# Patient Record
Sex: Female | Born: 1990 | Race: Black or African American | Hispanic: No | Marital: Single | Smoking: Former smoker
Health system: Southern US, Community
[De-identification: ages and names within clinical notes are randomized; demographics above are authoritative.]

## PROBLEM LIST (undated history)

## (undated) ENCOUNTER — Inpatient Hospital Stay (HOSPITAL_COMMUNITY): Payer: Self-pay

## (undated) DIAGNOSIS — B9689 Other specified bacterial agents as the cause of diseases classified elsewhere: Secondary | ICD-10-CM

## (undated) DIAGNOSIS — N760 Acute vaginitis: Secondary | ICD-10-CM

## (undated) DIAGNOSIS — D219 Benign neoplasm of connective and other soft tissue, unspecified: Secondary | ICD-10-CM

## (undated) DIAGNOSIS — A749 Chlamydial infection, unspecified: Secondary | ICD-10-CM

## (undated) DIAGNOSIS — N83209 Unspecified ovarian cyst, unspecified side: Secondary | ICD-10-CM

## (undated) DIAGNOSIS — E669 Obesity, unspecified: Secondary | ICD-10-CM

## (undated) DIAGNOSIS — N390 Urinary tract infection, site not specified: Secondary | ICD-10-CM

## (undated) HISTORY — PX: ANKLE SURGERY: SHX546

## (undated) HISTORY — DX: Benign neoplasm of connective and other soft tissue, unspecified: D21.9

## (undated) HISTORY — PX: INDUCED ABORTION: SHX677

## (undated) HISTORY — DX: Urinary tract infection, site not specified: N39.0

## (undated) HISTORY — DX: Obesity, unspecified: E66.9

---
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
## 2011-01-18 ENCOUNTER — Encounter (HOSPITAL_COMMUNITY): Payer: Self-pay

## 2011-01-18 ENCOUNTER — Emergency Department (INDEPENDENT_AMBULATORY_CARE_PROVIDER_SITE_OTHER)
Admission: EM | Admit: 2011-01-18 | Discharge: 2011-01-18 | Disposition: A | Discharge status: Home / Self Care | Source: Home / Self Care | Attending: Family Medicine | Admitting: Family Medicine

## 2011-01-18 DIAGNOSIS — R1030 Lower abdominal pain, unspecified: Secondary | ICD-10-CM

## 2011-01-18 DIAGNOSIS — R109 Unspecified abdominal pain: Secondary | ICD-10-CM

## 2011-01-18 LAB — POCT URINALYSIS DIP (DEVICE)
Hgb urine dipstick: NEGATIVE
Leukocytes, UA: NEGATIVE
Nitrite: NEGATIVE
Protein, ur: NEGATIVE mg/dL
pH: 6.5 (ref 5.0–8.0)

## 2011-01-18 LAB — WET PREP, GENITAL
Clue Cells Wet Prep HPF POC: NONE SEEN
Trich, Wet Prep: NONE SEEN

## 2011-01-18 NOTE — ED Notes (Signed)
Bilateral Lower abdominal area pain for past few weeks, no change in usual vaginal secretions; has IUD , that is reportedly difficult to locate; c/o episodic nausea

## 2011-01-18 NOTE — ED Provider Notes (Signed)
History     CSN: [id number] Arrival date & time: 01/18/2011  5:42 PM   First MD Initiated Contact with Patient 01/18/11 1627      Chief Complaint  Patient presents with  . Abdominal Pain    (Consider location/radiation/quality/duration/timing/severity/associated sxs/prior treatment) Patient is a 20 y.o. female presenting with abdominal pain. The history is provided by the patient.  Abdominal Pain The primary symptoms of the illness include abdominal pain and nausea. The primary symptoms of the illness do not include diarrhea, dysuria, vaginal discharge or vaginal bleeding. The current episode started more than 2 days ago. The onset of the illness was sudden. The problem has not changed since onset. The abdominal pain is located in the LLQ, RLQ and suprapubic region. The abdominal pain is relieved by nothing.  Symptoms associated with the illness do not include frequency.    History reviewed. No pertinent past medical history.  History reviewed. No pertinent past surgical history.  History reviewed. No pertinent family history.  History  Substance Use Topics  . Smoking status: Former Games developer  . Smokeless tobacco: Not on file  . Alcohol Use: No    OB History    Grav Para Term Preterm Abortions TAB SAB Ect Mult Living                  Review of Systems  Constitutional: Negative.   HENT: Negative.   Eyes: Negative.   Respiratory: Negative.   Cardiovascular: Negative.   Gastrointestinal: Positive for nausea and abdominal pain. Negative for diarrhea.  Genitourinary: Negative for dysuria, frequency, flank pain, vaginal bleeding, vaginal discharge and menstrual problem.    Allergies  Shellfish allergy  Home Medications   Current Outpatient Rx  Name Route Sig Dispense Refill  . NORGESTIMATE-ETH ESTRADIOL 0.25-35 MG-MCG PO TABS Oral Take 1 tablet by mouth daily. 1 month supply       BP 118/75  Pulse 84  Temp(Src) 98.1 F (36.7 C) (Oral)  Resp 18  SpO2  100%  Physical Exam  Constitutional: She appears well-developed and well-nourished.  HENT:  Head: Normocephalic and atraumatic.  Eyes: EOM are normal.  Neck: Neck supple.  Genitourinary: Vagina normal. There is no rash or tenderness on the right labia. There is no rash or tenderness on the left labia. Uterus is not enlarged and not tender. Cervix exhibits no motion tenderness and no discharge. Right adnexum displays no mass, no tenderness and no fullness. Left adnexum displays no mass, no tenderness and no fullness.       No IUD strings visible; cervical os slightly opened with mucus from the os; per patient, IUD strings were cut too short; scant vaginal discharge;     ED Course  Procedures (including critical care time)  Labs Reviewed - No data to display No results found.   No diagnosis found.    MDM  No IUD strings visible; wet prep pending; UA and Upreg negative        Richardo Priest, MD 01/18/11 1913

## 2011-01-18 NOTE — ED Notes (Signed)
Unable to locate Rx at time of d/c ; have called Rx for sprintec to [facility] at request of patient

## 2011-01-19 LAB — GC/CHLAMYDIA PROBE AMP, GENITAL
Chlamydia, DNA Probe: NEGATIVE
GC Probe Amp, Genital: NEGATIVE

## 2011-03-14 DIAGNOSIS — A749 Chlamydial infection, unspecified: Secondary | ICD-10-CM

## 2011-03-14 HISTORY — DX: Chlamydial infection, unspecified: A74.9

## 2011-04-18 ENCOUNTER — Emergency Department (INDEPENDENT_AMBULATORY_CARE_PROVIDER_SITE_OTHER)
Admission: EM | Admit: 2011-04-18 | Discharge: 2011-04-18 | Disposition: A | Discharge status: Home / Self Care | Source: Home / Self Care | Attending: Family Medicine | Admitting: Family Medicine

## 2011-04-18 ENCOUNTER — Encounter (HOSPITAL_COMMUNITY): Payer: Self-pay | Admitting: *Deleted

## 2011-04-18 DIAGNOSIS — N72 Inflammatory disease of cervix uteri: Secondary | ICD-10-CM

## 2011-04-18 LAB — POCT URINALYSIS DIP (DEVICE)
Bilirubin Urine: NEGATIVE
Ketones, ur: NEGATIVE mg/dL
Nitrite: NEGATIVE
Protein, ur: NEGATIVE mg/dL
pH: 5.5 (ref 5.0–8.0)

## 2011-04-18 LAB — WET PREP, GENITAL: Yeast Wet Prep HPF POC: NONE SEEN

## 2011-04-18 MED ORDER — METRONIDAZOLE 500 MG PO TABS: 500.0000 mg | ORAL_TABLET | Freq: Two times a day (BID) | ORAL | Status: AC

## 2011-04-18 MED ORDER — AZITHROMYCIN 250 MG PO TABS
ORAL_TABLET | ORAL | Status: AC
  Filled 2011-04-18: qty 4

## 2011-04-18 MED ORDER — CEFTRIAXONE SODIUM 250 MG IJ SOLR
250.0000 mg | Freq: Once | INTRAMUSCULAR | Status: AC
  Administered 2011-04-18: 250 mg via INTRAMUSCULAR

## 2011-04-18 MED ORDER — AZITHROMYCIN 250 MG PO TABS
1000.0000 mg | ORAL_TABLET | Freq: Once | ORAL | Status: AC
  Administered 2011-04-18: 1000 mg via ORAL

## 2011-04-18 MED ORDER — CEFTRIAXONE SODIUM 250 MG IJ SOLR
INTRAMUSCULAR | Status: AC
  Filled 2011-04-18: qty 250

## 2011-04-18 NOTE — ED Notes (Signed)
Pt  Reports  Low  abd  Cramps     With    Vaginal  Irritation           Reports  Some  Frequency  Of  Urination  As  Well     Symptoms  X  3  Weeks

## 2011-04-19 LAB — GC/CHLAMYDIA PROBE AMP, GENITAL: Chlamydia, DNA Probe: POSITIVE — AB

## 2011-04-20 ENCOUNTER — Telehealth (HOSPITAL_COMMUNITY): Payer: Self-pay | Admitting: *Deleted

## 2011-04-20 NOTE — ED Provider Notes (Signed)
History     CSN: [id number]  Arrival date & time 04/18/11  1747   First MD Initiated Contact with Patient 04/18/11 2004      Chief Complaint  Patient presents with  . Abdominal Cramping    (Consider location/radiation/quality/duration/timing/severity/associated sxs/prior treatment) HPI Comments: 21 y/o female no significant PMH here c/o vaginal irritation, vaginal discharge and low abdominal discomfort, frequency for 3 weeks. No fever or chills no nausea or vomiting.    History reviewed. No pertinent past medical history.  Past Surgical History  Procedure Date  . Ankle surgery     No family history on file.  History  Substance Use Topics  . Smoking status: Current Everyday Smoker  . Smokeless tobacco: Not on file  . Alcohol Use: Yes    OB History    Grav Para Term Preterm Abortions TAB SAB Ect Mult Living                  Review of Systems  Constitutional: Negative for fever and chills.  HENT: Negative for sore throat and neck pain.   Genitourinary: Positive for frequency, vaginal discharge, vaginal pain and pelvic pain. Negative for dysuria, flank pain, vaginal bleeding and genital sores.  Skin: Negative for rash.  Neurological: Negative for headaches.    Allergies  Shellfish allergy  Home Medications   Current Outpatient Rx  Name Route Sig Dispense Refill  . METRONIDAZOLE 500 MG PO TABS Oral Take 1 tablet (500 mg total) by mouth 2 (two) times daily. 14 tablet 0  . NORGESTIMATE-ETH ESTRADIOL 0.25-35 MG-MCG PO TABS Oral Take 1 tablet by mouth daily. 1 month supply       BP 120/70  Pulse 70  Temp(Src) 98.6 F (37 C) (Oral)  Resp 18  Physical Exam  Nursing note and vitals reviewed. Constitutional: She is oriented to person, place, and time. She appears well-developed and well-nourished. No distress.  HENT:  Head: Normocephalic and atraumatic.  Mouth/Throat: Oropharynx is clear and moist. No oropharyngeal exudate.  Eyes: Conjunctivae are normal.  Pupils are equal, round, and reactive to light.  Neck: Neck supple.  Cardiovascular: Normal heart sounds.   Pulmonary/Chest: Breath sounds normal.  Abdominal: Soft. She exhibits no mass. There is no tenderness. There is no rebound and no guarding. Hernia confirmed negative in the right inguinal area and confirmed negative in the left inguinal area.  Genitourinary: Uterus normal. Uterus is not enlarged and not tender. Cervix exhibits discharge and friability. Cervix exhibits no motion tenderness. Right adnexum displays no mass, no tenderness and no fullness. Left adnexum displays no mass, no tenderness and no fullness. There is erythema around the vagina. No bleeding around the vagina. No foreign body around the vagina. No signs of injury around the vagina. Vaginal discharge found.  Lymphadenopathy:    She has no cervical adenopathy.       Right: No inguinal adenopathy present.       Left: No inguinal adenopathy present.  Neurological: She is alert and oriented to person, place, and time.  Skin: No rash noted.    ED Course  Procedures (including critical care time)  Labs Reviewed  POCT URINALYSIS DIP (DEVICE) - Abnormal; Notable for the following:    Hgb urine dipstick SMALL (*)    Leukocytes, UA TRACE (*) Biochemical Testing Only. Please order routine urinalysis from main lab if confirmatory testing is needed.   All other components within normal limits  GC/CHLAMYDIA PROBE AMP, GENITAL - Abnormal; Notable for the following:  Chlamydia, DNA Probe POSITIVE (*)    All other components within normal limits  WET PREP, GENITAL - Abnormal; Notable for the following:    Clue Cells Wet Prep HPF POC MANY (*)    WBC, Wet Prep HPF POC MANY (*)    All other components within normal limits  POCT PREGNANCY, URINE  LAB REPORT - SCANNED   No results found.   1. Cervicitis       MDM  Clinically well. Erythema of cervic with endocervical exudate, no CMT. Treated prior discharge empirically  with rocephin 250mg  IMx1 and azithromycin 1 gram oral x1. Before results for for GH/CHl available. Encouraged condom use. Pt will be contacted and informed about her lab results.         Sharin Grave, MD 04/20/11 1135

## 2011-04-20 NOTE — ED Notes (Signed)
GC neg., Chlamydia pos., Wet prep: many clue cells, many WBC's. Pt. adequately treated with Zithromax for Chlamydia and Flagyl for bacterial vaginosis. DHHS form completed and faxed to the [facility]. Pt. verified, notified, told she was adeq. treated and given instructions ( see telephone encounter). Vassie Moselle 04/20/2011

## 2011-08-24 ENCOUNTER — Emergency Department (INDEPENDENT_AMBULATORY_CARE_PROVIDER_SITE_OTHER)
Admission: EM | Admit: 2011-08-24 | Discharge: 2011-08-24 | Disposition: A | Discharge status: Home / Self Care | Source: Home / Self Care | Attending: Emergency Medicine | Admitting: Emergency Medicine

## 2011-08-24 ENCOUNTER — Encounter (HOSPITAL_COMMUNITY): Payer: Self-pay | Admitting: *Deleted

## 2011-08-24 DIAGNOSIS — N760 Acute vaginitis: Secondary | ICD-10-CM

## 2011-08-24 HISTORY — DX: Chlamydial infection, unspecified: A74.9

## 2011-08-24 HISTORY — DX: Unspecified ovarian cyst, unspecified side: N83.209

## 2011-08-24 HISTORY — DX: Acute vaginitis: N76.0

## 2011-08-24 HISTORY — DX: Other specified bacterial agents as the cause of diseases classified elsewhere: B96.89

## 2011-08-24 LAB — POCT URINALYSIS DIP (DEVICE)
Bilirubin Urine: NEGATIVE
Glucose, UA: NEGATIVE mg/dL
Hgb urine dipstick: NEGATIVE
Specific Gravity, Urine: >=1.03 (ref 1.005–1.030)
pH: 5.5 (ref 5.0–8.0)

## 2011-08-24 LAB — WET PREP, GENITAL: Yeast Wet Prep HPF POC: NONE SEEN

## 2011-08-24 MED ORDER — AZITHROMYCIN 250 MG PO TABS
ORAL_TABLET | ORAL | Status: AC
  Filled 2011-08-24: qty 4

## 2011-08-24 MED ORDER — CEFTRIAXONE SODIUM 250 MG IJ SOLR
250.0000 mg | Freq: Once | INTRAMUSCULAR | Status: AC
  Administered 2011-08-24: 250 mg via INTRAMUSCULAR

## 2011-08-24 MED ORDER — CEFTRIAXONE SODIUM 250 MG IJ SOLR
INTRAMUSCULAR | Status: AC
  Filled 2011-08-24: qty 250

## 2011-08-24 MED ORDER — NAPROXEN 500 MG PO TABS: 500.0000 mg | ORAL_TABLET | Freq: Two times a day (BID) | ORAL | Status: DC

## 2011-08-24 MED ORDER — AZITHROMYCIN 250 MG PO TABS
1000.0000 mg | ORAL_TABLET | Freq: Once | ORAL | Status: AC
  Administered 2011-08-24: 1000 mg via ORAL

## 2011-08-24 MED ORDER — FLUCONAZOLE 150 MG PO TABS: ORAL_TABLET | ORAL | Status: AC

## 2011-08-24 MED ORDER — METRONIDAZOLE 500 MG PO TABS: 500.0000 mg | ORAL_TABLET | Freq: Two times a day (BID) | ORAL | Status: AC

## 2011-08-24 NOTE — ED Notes (Signed)
Pt  Was  Seen  Lat  Month  For  Std   adeq  tx     -  Continues  To  Have  Discharge  And  Low  abd   Cramping            She  Walks  Upright  With a  Slow  Steady  Gait

## 2011-08-24 NOTE — ED Provider Notes (Signed)
History     CSN: [id number]  Arrival date & time 08/24/11  1108   First MD Initiated Contact with Patient 08/24/11 1155      Chief Complaint  Patient presents with  . Vaginal Discharge    (Consider location/radiation/quality/duration/timing/severity/associated sxs/prior treatment) HPI Comments: Pt with  3 week's oderous vaginal discharge, intermittent, crampy, bilateral lower bowel pain. No urgency, frequency, dysuria, oderous urine, hematuria,  genital blisters, vaginal itching. She tried some OTC Monistat with some resolution in the vaginal discharge, but it returned. No fevers, N/V, back pain. No recent abx use. Pt sexually active with the same female partner who gave her Chlamydia in February. She states that she lied to her about getting treated, and is concerned that she may have chlamydia again. does not use condoms. STD's  a concern today. Similar sx before when had BV  and chlamydia. She was treated for both of these in February 2013.  Has a history of yeast infections.. No h/o trichomonas, syphilis, herpes, HIV.  ROS as noted in HPI. All other ROS negative.     Patient is a 21 y.o. female presenting with vaginal discharge. The history is provided by the patient. No language interpreter was used.  Vaginal Discharge This is a recurrent problem. The current episode started more than 1 week ago. The problem occurs constantly. The problem has not changed since onset.Associated symptoms include abdominal pain. Nothing aggravates the symptoms. The symptoms are relieved by medications. The treatment provided mild relief.    Past Medical History  Diagnosis Date  . BV (bacterial vaginosis)   . Chlamydia   . Ovarian cyst     Past Surgical History  Procedure Date  . Ankle surgery     History reviewed. No pertinent family history.  History  Substance Use Topics  . Smoking status: Current Everyday Smoker  . Smokeless tobacco: Not on file  . Alcohol Use: Yes    OB History    Grav Para Term Preterm Abortions TAB SAB Ect Mult Living                  Review of Systems  Gastrointestinal: Positive for abdominal pain.  Genitourinary: Positive for vaginal discharge.    Allergies  Shellfish allergy  Home Medications   Current Outpatient Rx  Name Route Sig Dispense Refill  . FLUCONAZOLE 150 MG PO TABS  1 tab po x 1. May repeat in 72 hours if no improvement 2 tablet 0  . METRONIDAZOLE 500 MG PO TABS Oral Take 1 tablet (500 mg total) by mouth 2 (two) times daily. X 7 days 14 tablet 0  . NAPROXEN 500 MG PO TABS Oral Take 1 tablet (500 mg total) by mouth 2 (two) times daily. 20 tablet 0    BP 123/71  Pulse 80  Temp 98.3 F (36.8 C) (Oral)  Resp 14  SpO2 99%  Physical Exam  Nursing note and vitals reviewed. Constitutional: She is oriented to person, place, and time. She appears well-developed and well-nourished. No distress.  HENT:  Head: Normocephalic and atraumatic.  Eyes: EOM are normal.  Neck: Normal range of motion.  Cardiovascular: Normal rate.   Pulmonary/Chest: Effort normal.  Abdominal: Soft. Bowel sounds are normal. She exhibits no distension. There is no tenderness. There is no rebound and no guarding.  Genitourinary: Uterus normal. Pelvic exam was performed with patient supine. There is no rash on the right labia. There is no rash on the left labia. Uterus is not tender. Cervix exhibits  no motion tenderness and no friability. Right adnexum displays no mass, no tenderness and no fullness. Left adnexum displays no mass, no tenderness and no fullness. No erythema, tenderness or bleeding around the vagina. No foreign body around the vagina. Vaginal discharge found.       Thick white, cottage cheesy nonodorous vaginal discharge. Some mucopurulent discharge from os. Chaperone present during exam  Musculoskeletal: Normal range of motion.  Neurological: She is alert and oriented to person, place, and time.  Skin: Skin is warm and dry.  Psychiatric: She  has a normal mood and affect. Her behavior is normal. Judgment and thought content normal.    ED Course  Procedures (including critical care time)   Labs Reviewed  POCT URINALYSIS DIP (DEVICE)  POCT PREGNANCY, URINE  WET PREP, GENITAL  GC/CHLAMYDIA PROBE AMP, GENITAL   No results found.   1. Vaginitis    Results for orders placed during the hospital encounter of 08/24/11  POCT URINALYSIS DIP (DEVICE)      Component Value Range   Glucose, UA NEGATIVE  NEGATIVE mg/dL   Bilirubin Urine NEGATIVE  NEGATIVE   Ketones, ur NEGATIVE  NEGATIVE mg/dL   Specific Gravity, Urine >=1.030  1.005 - 1.030   Hgb urine dipstick NEGATIVE  NEGATIVE   pH 5.5  5.0 - 8.0   Protein, ur NEGATIVE  NEGATIVE mg/dL   Urobilinogen, UA 0.2  0.0 - 1.0 mg/dL   Nitrite NEGATIVE  NEGATIVE   Leukocytes, UA NEGATIVE  NEGATIVE  POCT PREGNANCY, URINE      Component Value Range   Preg Test, Ur NEGATIVE  NEGATIVE     MDM  Previous labs reviewed. History of recurrent BV and Chlamydia. H&P most c/w yeast infection, but will also treat for BV as she has had this twice recently. Sent off GC/chlamydia, wet prep. Will treat empirically now. Giving ceftriaxone 250 mg IM/azithro 1 gm po. Will send home with flagyl and  diflucan for yeast infection. Advised pt to refrain from sexual contact until she knows lab results, symptoms resolve, and partner(s) are treated. Pt provided working phone number. Pt agrees.     Luiz Blare, MD 08/24/11 1256

## 2011-08-24 NOTE — Discharge Instructions (Signed)
Take the medication as written. Give us a working phone number so that we can contact you if needed. Refrain from sexual contact until you know your results and your partner(s) are treated. Return if you get worse, have a fever >100.4, or for any concerns.  ° °Go to www.goodrx.com to look up your medications. This will give you a list of where you can find your prescriptions at the most affordable prices.  °

## 2011-08-26 LAB — GC/CHLAMYDIA PROBE AMP, GENITAL
Chlamydia, DNA Probe: POSITIVE — AB
GC Probe Amp, Genital: NEGATIVE

## 2011-08-28 ENCOUNTER — Telehealth (HOSPITAL_COMMUNITY): Payer: Self-pay | Admitting: *Deleted

## 2011-08-28 NOTE — ED Notes (Signed)
GC neg., Chlamydia pos., Wet prep: few clue cells, mod. WBC's. Pt. Adequately treated with Zithromax and Flagyl.  I called cell # but it is not in service. I called home # and left a message to call. DHHS form completed and faxed to the [facility]. Vassie Moselle 08/28/2011

## 2011-08-29 ENCOUNTER — Telehealth (HOSPITAL_COMMUNITY): Payer: Self-pay | Admitting: *Deleted

## 2011-08-29 NOTE — ED Notes (Signed)
Left message to call.  Call 2. Janice Chan 08/29/2011

## 2011-08-30 ENCOUNTER — Telehealth (HOSPITAL_COMMUNITY): Payer: Self-pay | Admitting: *Deleted

## 2011-08-30 NOTE — ED Notes (Signed)
I called pt.'s contact Page Spiro -aunt and left a message for pt. to call. Vassie Moselle 08/30/2011

## 2011-08-30 NOTE — ED Notes (Signed)
Pt. called back and was verified x 2. Pt. given results and told she was adequately treated with Zithromax for Chlamydia and Flagyl for possible bacterial vaginosis.   Pt. instructed to notify her partner, no sex for 1 week and to practice safe sex. Pt. told she can get HIV testing at the [facility]. STD clinic. Vassie Moselle 08/30/2011

## 2012-01-19 ENCOUNTER — Encounter: Payer: Self-pay | Admitting: Obstetrics & Gynecology

## 2012-01-19 ENCOUNTER — Encounter: Payer: Self-pay | Admitting: *Deleted

## 2012-01-19 ENCOUNTER — Ambulatory Visit (INDEPENDENT_AMBULATORY_CARE_PROVIDER_SITE_OTHER): Admitting: Obstetrics & Gynecology

## 2012-01-19 VITALS — BP 129/85 | HR 73 | Temp 97.4°F | Ht 64.75 in | Wt 180.6 lb

## 2012-01-19 DIAGNOSIS — N949 Unspecified condition associated with female genital organs and menstrual cycle: Secondary | ICD-10-CM

## 2012-01-19 DIAGNOSIS — Z23 Encounter for immunization: Secondary | ICD-10-CM

## 2012-01-19 DIAGNOSIS — R102 Pelvic and perineal pain: Secondary | ICD-10-CM

## 2012-01-19 MED ORDER — HPV QUADRIVALENT VACCINE IM SUSP: 0.5000 mL | Freq: Once | INTRAMUSCULAR | Status: DC

## 2012-01-19 NOTE — Progress Notes (Signed)
Here today for pelvic pain, states last year was told had ovarian cyst. Has an IUD in place. C/o intermittent, frequent throbbing pain mid pelvic and states sometimes shoots across abdomen.  Takes midol and was working , but now takes strong motrin or took aunt's vicodin once and that helped relieve the pain.  States when it is hurting a lot, can't sit up straight because of pain and pressure, has to lean back. Also c/o pain with intercourse for last 2 months.

## 2012-01-19 NOTE — Progress Notes (Signed)
  Subjective:    Patient ID: Janice Chan, female    DOB: December 09, 1990, 21 y.o.   MRN: [id number]  HPI  21 yo S AA G0 who is here for 2 month h/o pelvic pain. She has had 3 occasions of +chlamydia in the last few years, most recently 5 months ago. I could not find a TOC noted. She reports dyspareunia for the last several months.   Review of Systems She has had 2 Gardasil shots, but not the third. Mirena in place    Objective:   Physical Exam  Whitish cervical discharge, IUD strings not seen (she says they are not visible because they were cut short) No CMT NSS midplane, NT, No adnexal masses or tenderness      Assessment & Plan:  Preventative care- restart the Gardasil series today. RTC 2 months Pelvic pain-may be related to her recurrent CT infections. She and I have discussed safe sex practices and possible consequences of PID (including infertility, CPP). I will recheck for GC/CT today Flu vaccine today

## 2012-01-20 LAB — GC/CHLAMYDIA PROBE AMP, URINE
Chlamydia, Swab/Urine, PCR: NEGATIVE
GC Probe Amp, Urine: NEGATIVE

## 2012-01-24 ENCOUNTER — Ambulatory Visit (HOSPITAL_COMMUNITY)
Admission: RE | Admit: 2012-01-24 | Discharge: 2012-01-24 | Disposition: A | Discharge status: Home / Self Care | Source: Ambulatory Visit | Attending: Obstetrics & Gynecology | Admitting: Obstetrics & Gynecology

## 2012-01-24 DIAGNOSIS — Z30431 Encounter for routine checking of intrauterine contraceptive device: Secondary | ICD-10-CM | POA: Insufficient documentation

## 2012-01-24 DIAGNOSIS — R102 Pelvic and perineal pain: Secondary | ICD-10-CM

## 2012-01-24 DIAGNOSIS — N949 Unspecified condition associated with female genital organs and menstrual cycle: Secondary | ICD-10-CM | POA: Insufficient documentation

## 2012-03-14 ENCOUNTER — Telehealth: Payer: Self-pay | Admitting: *Deleted

## 2012-03-14 NOTE — Telephone Encounter (Signed)
Pt left a message stating that she wants to know the results of her ultrasound and labs from her visit with Dr. Marice Potter in November. She is still having some pain.

## 2012-03-15 NOTE — Telephone Encounter (Signed)
Called pt and informed her of normal [place] and negative GC/Chlamydia results.  Pt states she is continuing to have pelvic pain and does not understand why. I asked pt is she would like a f/u appt to see MD at the time of her next Gardasil injection which needs to be later this month. Pt stated yes. I informed her that someone will call her next week with appt information. Pt voiced understanding.

## 2012-03-19 ENCOUNTER — Encounter: Payer: Self-pay | Admitting: Obstetrics & Gynecology

## 2012-04-03 ENCOUNTER — Ambulatory Visit: Admitting: Obstetrics & Gynecology

## 2012-04-08 ENCOUNTER — Ambulatory Visit (INDEPENDENT_AMBULATORY_CARE_PROVIDER_SITE_OTHER): Admitting: Obstetrics & Gynecology

## 2012-04-08 ENCOUNTER — Encounter: Payer: Self-pay | Admitting: Obstetrics & Gynecology

## 2012-04-08 VITALS — BP 122/78 | HR 92 | Temp 97.7°F | Ht 64.5 in | Wt 179.4 lb

## 2012-04-08 DIAGNOSIS — Z0000 Encounter for general adult medical examination without abnormal findings: Secondary | ICD-10-CM

## 2012-04-08 DIAGNOSIS — N949 Unspecified condition associated with female genital organs and menstrual cycle: Secondary | ICD-10-CM

## 2012-04-08 DIAGNOSIS — G8929 Other chronic pain: Secondary | ICD-10-CM

## 2012-04-08 DIAGNOSIS — Z01419 Encounter for gynecological examination (general) (routine) without abnormal findings: Secondary | ICD-10-CM

## 2012-04-08 NOTE — Progress Notes (Signed)
  Subjective:    Patient ID: Janice Chan, female    DOB: 12-09-1990, 21 y.o.   MRN: [id number]  HPI  22 yo S AA lady here for follow up of her pelvic discomfort and for her second Gardasil vaccine. She says that her pelvic pain is much less and is not causing her difficulties. Her u/s 11-13 was normal. IUD was seen in normal position.  Review of Systems  Her GC and CT from last visit were negative.    Objective:   Physical Exam        Assessment & Plan:  As above

## 2012-04-08 NOTE — Progress Notes (Signed)
We were not able to administer ordered Giardasil today. We ordered Rx and will arrive in a couple of days. Made note under "staff message" to call patient when Rx arrives and to schedule a nurse visit to administer it. Ok by Dr. Marice Potter and patient.

## 2012-04-10 ENCOUNTER — Telehealth: Payer: Self-pay | Admitting: General Practice

## 2012-04-10 NOTE — Telephone Encounter (Signed)
Called patient, no answer and the voicemail box had not been set up yet so I was unable to leave a message

## 2012-04-10 NOTE — Telephone Encounter (Signed)
Message copied by Kathee Delton on Wed Apr 10, 2012 10:02 AM ------      Message from: Toula Moos      Created: Mon Apr 08, 2012  4:18 PM      Regarding: Call patient when Janith Lima arrives       Please call patient [place], [place] when new order of Janith Lima arrives and schedule a nurse visit to administer. Thank you.

## 2012-04-10 NOTE — Telephone Encounter (Signed)
Spoke to patient and made appt for Giardasil injection to be administered on 04/17/12 @1 :30pm. Patient agrees and satisfied.

## 2012-04-17 ENCOUNTER — Ambulatory Visit

## 2012-09-10 ENCOUNTER — Encounter: Payer: Self-pay | Admitting: Family Medicine

## 2012-09-10 ENCOUNTER — Ambulatory Visit (INDEPENDENT_AMBULATORY_CARE_PROVIDER_SITE_OTHER): Admitting: Family Medicine

## 2012-09-10 VITALS — BP 114/75 | HR 90 | Wt 167.0 lb

## 2012-09-10 DIAGNOSIS — Z0000 Encounter for general adult medical examination without abnormal findings: Secondary | ICD-10-CM

## 2012-09-10 NOTE — Progress Notes (Signed)
Subjective:     Patient ID: Janice Chan, female   DOB: October 17, 1990, 22 y.o.   MRN: [id number]  HPI 22 year old female presents for yearly check up.   - Patient requesting note on letterhead discussing mild allergy to shellfish as she is entering the [employer].   - Patient has no current complaints and states that she is doing well. - Currently on no medications.   - Currently using Mirena for Contraception.   - No current chronic medical problems.   Review of Systems Patient denies SOB, chest pain, fevers, chills, N/V/D.    Objective:   Physical Exam Filed Vitals:   09/10/12 1600  BP: 114/75  Pulse: 90  Gen: well appearing female in NAD. Heart: RRR. No m/r/g. Lungs: CTAB. No rales, rhonchi, or wheezing. Abd: soft, nontender, nondistended.  Ext: no appreciable lower extremity edema bilaterally Neuro: no focal deficits.   *Patient deferred Pelvic exam/pap smear.  Patient to follow up for exam this year after discussion of ACOG guidelines recommending Pap smear starting at age 62.    Assessment:  Healthy 23 year old female.    Plan:     Patient given letter stating mild allergy to shellfish.  Patient to follow up for Pap smear/breast exam.

## 2012-09-30 ENCOUNTER — Telehealth: Payer: Self-pay | Admitting: Family Medicine

## 2012-09-30 NOTE — Telephone Encounter (Signed)
Pt is requesting a referral per the [employer]. 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 So she would like  Referral to one that accepts tri-care. JW

## 2012-10-02 NOTE — Telephone Encounter (Signed)
I'm unsure why this is really necessary.  The patient has a mild shellfish allergy.  Is there anyway the person Sales promotion account executive) can contact me?

## 2012-10-03 ENCOUNTER — Telehealth: Payer: Self-pay | Admitting: Family Medicine

## 2012-10-03 NOTE — Telephone Encounter (Signed)
Patient calls asking to speak to Dr. Adriana Simas. She says she spoke to her recruiter about her allergy testing and now has questions for Dr. Adriana Simas.

## 2012-10-03 NOTE — Telephone Encounter (Signed)
Spoke with patient she states that proof of testing is needed to confirm her shellfish allergy. She would like to be either referred to an allergist or see if this can be proven by Dr. Adriana Simas.

## 2012-10-07 ENCOUNTER — Telehealth: Payer: Self-pay | Admitting: Family Medicine

## 2012-10-07 DIAGNOSIS — Z91013 Allergy to seafood: Secondary | ICD-10-CM

## 2012-10-07 NOTE — Addendum Note (Signed)
Addended by: Tommie Sams on: 10/07/2012 06:06 PM   Modules accepted: Orders

## 2012-10-07 NOTE — Telephone Encounter (Signed)
Order placed for Allergy referral

## 2012-10-07 NOTE — Telephone Encounter (Signed)
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 JW

## 2013-10-03 ENCOUNTER — Other Ambulatory Visit: Payer: Self-pay | Admitting: Family Medicine

## 2013-10-03 DIAGNOSIS — Z30432 Encounter for removal of intrauterine contraceptive device: Secondary | ICD-10-CM

## 2013-10-06 ENCOUNTER — Ambulatory Visit
Admission: RE | Admit: 2013-10-06 | Discharge: 2013-10-06 | Disposition: A | Discharge status: Home / Self Care | Payer: BC Managed Care – PPO | Source: Ambulatory Visit | Attending: Family Medicine | Admitting: Family Medicine

## 2013-10-06 DIAGNOSIS — Z30432 Encounter for removal of intrauterine contraceptive device: Secondary | ICD-10-CM

## 2013-11-25 ENCOUNTER — Other Ambulatory Visit: Payer: Self-pay | Admitting: Family Medicine

## 2013-11-25 DIAGNOSIS — H538 Other visual disturbances: Secondary | ICD-10-CM

## 2013-11-25 DIAGNOSIS — G44029 Chronic cluster headache, not intractable: Secondary | ICD-10-CM

## 2013-11-30 ENCOUNTER — Ambulatory Visit
Admission: RE | Admit: 2013-11-30 | Discharge: 2013-11-30 | Disposition: A | Discharge status: Home / Self Care | Payer: BC Managed Care – PPO | Source: Ambulatory Visit | Attending: Family Medicine | Admitting: Family Medicine

## 2013-11-30 DIAGNOSIS — G44029 Chronic cluster headache, not intractable: Secondary | ICD-10-CM

## 2013-11-30 DIAGNOSIS — H538 Other visual disturbances: Secondary | ICD-10-CM

## 2013-11-30 MED ORDER — GADOBENATE DIMEGLUMINE 529 MG/ML IV SOLN
15.0000 mL | Freq: Once | INTRAVENOUS | Status: AC | PRN
  Administered 2013-11-30: 15 mL via INTRAVENOUS

## 2014-05-18 ENCOUNTER — Emergency Department (HOSPITAL_COMMUNITY)
Admission: EM | Admit: 2014-05-18 | Discharge: 2014-05-18 | Disposition: A | Discharge status: Home / Self Care | Payer: Medicaid Other | Attending: Emergency Medicine | Admitting: Emergency Medicine

## 2014-05-18 ENCOUNTER — Encounter (HOSPITAL_COMMUNITY): Payer: Self-pay | Admitting: Emergency Medicine

## 2014-05-18 DIAGNOSIS — L02411 Cutaneous abscess of right axilla: Secondary | ICD-10-CM | POA: Diagnosis not present

## 2014-05-18 DIAGNOSIS — E669 Obesity, unspecified: Secondary | ICD-10-CM | POA: Diagnosis not present

## 2014-05-18 DIAGNOSIS — Z87891 Personal history of nicotine dependence: Secondary | ICD-10-CM | POA: Diagnosis not present

## 2014-05-18 DIAGNOSIS — Z8742 Personal history of other diseases of the female genital tract: Secondary | ICD-10-CM | POA: Insufficient documentation

## 2014-05-18 DIAGNOSIS — Z8619 Personal history of other infectious and parasitic diseases: Secondary | ICD-10-CM | POA: Diagnosis not present

## 2014-05-18 DIAGNOSIS — M79601 Pain in right arm: Secondary | ICD-10-CM | POA: Diagnosis present

## 2014-05-18 MED ORDER — HYDROCODONE-ACETAMINOPHEN 5-325 MG PO TABS: 2.0000 | ORAL_TABLET | Freq: Four times a day (QID) | ORAL | Status: DC | PRN

## 2014-05-18 MED ORDER — CLINDAMYCIN HCL 150 MG PO CAPS: 150.0000 mg | ORAL_CAPSULE | Freq: Four times a day (QID) | ORAL | Status: DC

## 2014-05-18 MED ORDER — LIDOCAINE-EPINEPHRINE 2 %-1:100000 IJ SOLN
10.0000 mL | Freq: Once | INTRAMUSCULAR | Status: DC
  Filled 2014-05-18: qty 1

## 2014-05-18 NOTE — ED Provider Notes (Signed)
CSN: [id number]     Arrival date & time 05/18/14  1807 History  This chart was scribed for non-physician practitioner working with Dorie Rank, MD, by Stephania Fragmin, ED Scribe. This patient was seen in room [place] and the patient's care was started at 7:13 PM.    Chief Complaint  Patient presents with  . Arm Pain    Arm pit  . Arm Swelling  . Abscess   Patient is a 24 y.o. female presenting with abscess. The history is provided by the patient. No language interpreter was used.  Abscess Associated symptoms: no fever      HPI Comments: [place] is a 24 y.o. female who presents to the Emergency Department complaining of painful knots on her right arm that began as one knot 2 days ago, and another that appeared when she woke up this morning  She complains of associated severe, shooting pain down her right arm radiating into her back and shoulder. She does endorse an occasional cough. Patient notes that a week ago, she had sudden onset, burning pelvic pain when she woke up and lasting for several days, which was unusual because she has not been sexually active. She thought it may have been an allergic reaction to toilet paper, since she had gone out the night before. She denies fever, rhinorrhea, sneezing, sore throat, rash, or dysuria. Patient is currently in the middle of a menstrual cycle, so she is unsure whether she has any vaginal discharge. She states she hasn't noted any knots on her groin.     Past Medical History  Diagnosis Date  . BV (bacterial vaginosis)   . Chlamydia     at least 3 occasions, most recently 2013  . Ovarian cyst   . Obesity    Past Surgical History  Procedure Laterality Date  . Ankle surgery     Family History  Problem Relation Age of Onset  . Asthma Sister    History  Substance Use Topics  . Smoking status: Former Research scientist (life sciences)  . Smokeless tobacco: Never Used  . Alcohol Use: Yes     Comment: occasional   OB History    Gravida Para Term Preterm AB TAB  SAB Ectopic Multiple Living   0 0 0 0 0 0 0 0 0 0      Review of Systems  Constitutional: Negative for fever.  HENT: Negative for rhinorrhea, sneezing and sore throat.   Genitourinary: Negative for dysuria.  Skin: Positive for wound. Negative for rash.      Allergies  Review of patient's allergies indicates no known allergies.  Home Medications   Prior to Admission medications   Medication Sig Start Date End Date Taking? Authorizing Provider  ibuprofen (ADVIL,MOTRIN) 200 MG tablet Take 600 mg by mouth every 6 (six) hours as needed for moderate pain.    Yes Historical Provider, MD   BP 126/93 mmHg  Pulse 70  Temp(Src) 97.8 F (36.6 C) (Oral)  Resp 16  SpO2 96%  LMP 05/16/2014 Physical Exam  Constitutional: She is oriented to person, place, and time. She appears well-developed and well-nourished. No distress.  HENT:  Head: Normocephalic and atraumatic.  Eyes: Conjunctivae and EOM are normal.  Neck: Neck supple. No tracheal deviation present.  Cardiovascular: Normal rate.   Pulmonary/Chest: Effort normal. No respiratory distress.  Musculoskeletal: Normal range of motion.  Neurological: She is alert and oriented to person, place, and time.  Skin: Skin is warm and dry.  Area of induration and fluctuance noted  to the right axillary region, with exquisite TTP. No overlying skin changes. There is one measuring the size of a kidney bean, and another area of induration and fluctuance noted proximal to the above noted, with tenderness to palpation. No erythema.   Psychiatric: She has a normal mood and affect. Her behavior is normal.  Nursing note and vitals reviewed.   ED Course  Procedures (including critical care time)  DIAGNOSTIC STUDIES: Oxygen Saturation is 96% on room air, normal by my interpretation.    COORDINATION OF CARE: 7:20 PM - Discussed several options with pt at bedside, including antibiotics and I&D. Because patient states she wants pain relief, we both agreed  that I&D would be the best course of action.   INCISION AND DRAINAGE Performed by: Domenic Moras Consent: Verbal consent obtained. Risks and benefits: risks, benefits and alternatives were discussed Type: abscess  Body area: R axillary fold (lateral)  Anesthesia: local infiltration  Incision was made with a scalpel.  Local anesthetic: lidocaine 2% w epinephrine  Anesthetic total: 2 ml  Complexity: complex Blunt dissection to break up loculations  Drainage: purulent  Drainage amount: mild  Packing material: 1/4 in iodoform gauze  Patient tolerance: Patient tolerated the procedure well with no immediate complications.  INCISION AND DRAINAGE Performed by: Domenic Moras Consent: Verbal consent obtained. Risks and benefits: risks, benefits and alternatives were discussed Type: abscess  Body area: R axillary fold (medial)  Anesthesia: local infiltration  Incision was made with a scalpel.  Local anesthetic: lidocaine 2% w epinephrine  Anesthetic total: 2 ml  Complexity: complex Blunt dissection to break up loculations  Drainage: purulent  Drainage amount: moderate  Packing material: 1/4 in iodoform gauze  Patient tolerance: Patient tolerated the procedure well with no immediate complications.       Labs Review Labs Reviewed - No data to display  Imaging Review No results found.   EKG Interpretation None      MDM   Final diagnoses:  Cutaneous abscess of right axilla   BP 126/93 mmHg  Pulse 70  Temp(Src) 97.8 F (36.6 C) (Oral)  Resp 16  SpO2 96%  LMP 05/16/2014   I personally performed the services described in this documentation, which was scribed in my presence. The recorded information has been reviewed and is accurate.     Domenic Moras, PA-C 05/18/14 2011  Dorie Rank, MD 05/19/14 [phone]

## 2014-05-18 NOTE — ED Notes (Signed)
Pt sts that Saturday she noticed a "knot" in her R armpit. Pt sts the knot was sore then. Today, pt woke up unable to move her arm due to pain and c/o multiple knots in armpit. Pt able to move extremity with pain. A&Ox4. Pt sts she had an abortion at the end of January and ever since then her body has been "out of whack."

## 2014-05-18 NOTE — Discharge Instructions (Signed)
Please run warm water over the abscesses several times a day or apply warm compress to help encourage drainage.  Take pain medication and antibiotics as prescribed.  Follow up at your doctor's office or at urgent care in 2 days for wound recheck.    Abscess An abscess is an infected area that contains a collection of pus and debris.It can occur in almost any part of the body. An abscess is also known as a furuncle or boil. CAUSES  An abscess occurs when tissue gets infected. This can occur from blockage of oil or sweat glands, infection of hair follicles, or a minor injury to the skin. As the body tries to fight the infection, pus collects in the area and creates pressure under the skin. This pressure causes pain. People with weakened immune systems have difficulty fighting infections and get certain abscesses more often.  SYMPTOMS Usually an abscess develops on the skin and becomes a painful mass that is red, warm, and tender. If the abscess forms under the skin, you may feel a moveable soft area under the skin. Some abscesses break open (rupture) on their own, but most will continue to get worse without care. The infection can spread deeper into the body and eventually into the bloodstream, causing you to feel ill.  DIAGNOSIS  Your caregiver will take your medical history and perform a physical exam. A sample of fluid may also be taken from the abscess to determine what is causing your infection. TREATMENT  Your caregiver may prescribe antibiotic medicines to fight the infection. However, taking antibiotics alone usually does not cure an abscess. Your caregiver may need to make a small cut (incision) in the abscess to drain the pus. In some cases, gauze is packed into the abscess to reduce pain and to continue draining the area. HOME CARE INSTRUCTIONS   Only take over-the-counter or prescription medicines for pain, discomfort, or fever as directed by your caregiver.  If you were prescribed  antibiotics, take them as directed. Finish them even if you start to feel better.  If gauze is used, follow your caregiver's directions for changing the gauze.  To avoid spreading the infection:  Keep your draining abscess covered with a bandage.  Wash your hands well.  Do not share personal care items, towels, or whirlpools with others.  Avoid skin contact with others.  Keep your skin and clothes clean around the abscess.  Keep all follow-up appointments as directed by your caregiver. SEEK MEDICAL CARE IF:   You have increased pain, swelling, redness, fluid drainage, or bleeding.  You have muscle aches, chills, or a general ill feeling.  You have a fever. MAKE SURE YOU:   Understand these instructions.  Will watch your condition.  Will get help right away if you are not doing well or get worse. Document Released: 12/07/2004 Document Revised: 08/29/2011 Document Reviewed: 05/12/2011 [facility] Patient Information 2015 [place], [place]. This information is not intended to replace advice given to you by your health care provider. Make sure you discuss any questions you have with your health care provider.

## 2014-05-20 ENCOUNTER — Encounter (HOSPITAL_COMMUNITY): Payer: Self-pay

## 2014-05-20 ENCOUNTER — Emergency Department (INDEPENDENT_AMBULATORY_CARE_PROVIDER_SITE_OTHER)
Admission: EM | Admit: 2014-05-20 | Discharge: 2014-05-20 | Disposition: A | Discharge status: Home / Self Care | Payer: Medicaid Other | Source: Home / Self Care | Attending: Family Medicine | Admitting: Family Medicine

## 2014-05-20 DIAGNOSIS — Z09 Encounter for follow-up examination after completed treatment for conditions other than malignant neoplasm: Secondary | ICD-10-CM

## 2014-05-20 NOTE — Discharge Instructions (Signed)
Finish antibiotic, continue local washing and triple antibiotic ointment as needed.return if any further problems.

## 2014-05-20 NOTE — ED Provider Notes (Signed)
CSN: [id number]     Arrival date & time 05/20/14  1934 History   First MD Initiated Contact with Patient 05/20/14 2019     Chief Complaint  Patient presents with  . Wound Check   (Consider location/radiation/quality/duration/timing/severity/associated sxs/prior Treatment) Patient is a 24 y.o. female presenting with wound check. The history is provided by the patient.  Wound Check This is a new problem. The current episode started 2 days ago (had i+d in ER  on 3/7, here for packing removal.). The problem has been resolved.    Past Medical History  Diagnosis Date  . BV (bacterial vaginosis)   . Chlamydia     at least 3 occasions, most recently 2013  . Ovarian cyst   . Obesity    Past Surgical History  Procedure Laterality Date  . Ankle surgery     Family History  Problem Relation Age of Onset  . Asthma Sister    History  Substance Use Topics  . Smoking status: Former Research scientist (life sciences)  . Smokeless tobacco: Never Used  . Alcohol Use: Yes     Comment: occasional   OB History    Gravida Para Term Preterm AB TAB SAB Ectopic Multiple Living   0 0 0 0 0 0 0 0 0 0      Review of Systems  Skin: Positive for wound.    Allergies  Review of patient's allergies indicates no known allergies.  Home Medications   Prior to Admission medications   Medication Sig Start Date End Date Taking? Authorizing Provider  clindamycin (CLEOCIN) 150 MG capsule Take 1 capsule (150 mg total) by mouth every 6 (six) hours. 05/18/14   Domenic Moras, PA-C  HYDROcodone-acetaminophen (NORCO/VICODIN) 5-325 MG per tablet Take 2 tablets by mouth every 6 (six) hours as needed for moderate pain. 05/18/14   Domenic Moras, PA-C  ibuprofen (ADVIL,MOTRIN) 200 MG tablet Take 600 mg by mouth every 6 (six) hours as needed for moderate pain.     Historical Provider, MD   BP 117/77 mmHg  Pulse 67  Temp(Src) 99.1 F (37.3 C) (Oral)  Resp 16  SpO2 100%  LMP 05/16/2014 Physical Exam  Constitutional: She is oriented to person,  place, and time. She appears well-developed and well-nourished. No distress.  Neurological: She is alert and oriented to person, place, and time.  Skin: Skin is warm and dry. No erythema.  Packing removed, nontender, no drainage.from right axilla.  Nursing note and vitals reviewed.   ED Course  Procedures (including critical care time) Labs Review Labs Reviewed - No data to display  Imaging Review No results found.   MDM   1. Encounter for recheck of abscess following incision and drainage       Billy Fischer, MD 05/20/14 2047

## 2014-05-20 NOTE — ED Notes (Signed)
Boil check , seen initally [facility] 3-7

## 2014-10-04 ENCOUNTER — Emergency Department (HOSPITAL_COMMUNITY)
Admission: EM | Admit: 2014-10-04 | Discharge: 2014-10-04 | Disposition: A | Discharge status: Home / Self Care | Payer: Medicaid Other | Attending: Emergency Medicine | Admitting: Emergency Medicine

## 2014-10-04 ENCOUNTER — Encounter (HOSPITAL_COMMUNITY): Payer: Self-pay

## 2014-10-04 DIAGNOSIS — Z8619 Personal history of other infectious and parasitic diseases: Secondary | ICD-10-CM | POA: Diagnosis not present

## 2014-10-04 DIAGNOSIS — Z8742 Personal history of other diseases of the female genital tract: Secondary | ICD-10-CM | POA: Insufficient documentation

## 2014-10-04 DIAGNOSIS — S4991XA Unspecified injury of right shoulder and upper arm, initial encounter: Secondary | ICD-10-CM | POA: Diagnosis present

## 2014-10-04 DIAGNOSIS — Y9389 Activity, other specified: Secondary | ICD-10-CM | POA: Insufficient documentation

## 2014-10-04 DIAGNOSIS — Y92410 Unspecified street and highway as the place of occurrence of the external cause: Secondary | ICD-10-CM | POA: Insufficient documentation

## 2014-10-04 DIAGNOSIS — Y998 Other external cause status: Secondary | ICD-10-CM | POA: Insufficient documentation

## 2014-10-04 DIAGNOSIS — E669 Obesity, unspecified: Secondary | ICD-10-CM | POA: Insufficient documentation

## 2014-10-04 DIAGNOSIS — S46911A Strain of unspecified muscle, fascia and tendon at shoulder and upper arm level, right arm, initial encounter: Secondary | ICD-10-CM

## 2014-10-04 DIAGNOSIS — S46011A Strain of muscle(s) and tendon(s) of the rotator cuff of right shoulder, initial encounter: Secondary | ICD-10-CM | POA: Insufficient documentation

## 2014-10-04 DIAGNOSIS — Z87891 Personal history of nicotine dependence: Secondary | ICD-10-CM | POA: Insufficient documentation

## 2014-10-04 MED ORDER — MELOXICAM 7.5 MG PO TABS: 7.5000 mg | ORAL_TABLET | Freq: Two times a day (BID) | ORAL | Status: DC

## 2014-10-04 MED ORDER — METHOCARBAMOL 500 MG PO TABS: 500.0000 mg | ORAL_TABLET | Freq: Two times a day (BID) | ORAL | Status: DC

## 2014-10-04 NOTE — Discharge Instructions (Signed)
Cryotherapy °Cryotherapy means treatment with cold. Ice or gel packs can be used to reduce both pain and swelling. Ice is the most helpful within the first 24 to 48 hours after an injury or flare-up from overusing a muscle or joint. Sprains, strains, spasms, burning pain, shooting pain, and aches can all be eased with ice. Ice can also be used when recovering from surgery. Ice is effective, has very few side effects, and is safe for most people to use. °PRECAUTIONS  °Ice is not a safe treatment option for people with: °· Raynaud phenomenon. This is a condition affecting small blood vessels in the extremities. Exposure to cold may cause your problems to return. °· Cold hypersensitivity. There are many forms of cold hypersensitivity, including: °· Cold urticaria. Red, itchy hives appear on the skin when the tissues begin to warm after being iced. °· Cold erythema. This is a red, itchy rash caused by exposure to cold. °· Cold hemoglobinuria. Red blood cells break down when the tissues begin to warm after being iced. The hemoglobin that carry oxygen are passed into the urine because they cannot combine with blood proteins fast enough. °· Numbness or altered sensitivity in the area being iced. °If you have any of the following conditions, do not use ice until you have discussed cryotherapy with your caregiver: °· Heart conditions, such as arrhythmia, angina, or chronic heart disease. °· High blood pressure. °· Healing wounds or open skin in the area being iced. °· Current infections. °· Rheumatoid arthritis. °· Poor circulation. °· Diabetes. °Ice slows the blood flow in the region it is applied. This is beneficial when trying to stop inflamed tissues from spreading irritating chemicals to surrounding tissues. However, if you expose your skin to cold temperatures for too long or without the proper protection, you can damage your skin or nerves. Watch for signs of skin damage due to cold. °HOME CARE INSTRUCTIONS °Follow  these tips to use ice and cold packs safely. °· Place a dry or damp towel between the ice and skin. A damp towel will cool the skin more quickly, so you may need to shorten the time that the ice is used. °· For a more rapid response, add gentle compression to the ice. °· Ice for no more than 10 to 20 minutes at a time. The bonier the area you are icing, the less time it will take to get the benefits of ice. °· Check your skin after 5 minutes to make sure there are no signs of a poor response to cold or skin damage. °· Rest 20 minutes or more between uses. °· Once your skin is numb, you can end your treatment. You can test numbness by very lightly touching your skin. The touch should be so light that you do not see the skin dimple from the pressure of your fingertip. When using ice, most people will feel these normal sensations in this order: cold, burning, aching, and numbness. °· Do not use ice on someone who cannot communicate their responses to pain, such as small children or people with dementia. °HOW TO MAKE AN ICE PACK °Ice packs are the most common way to use ice therapy. Other methods include ice massage, ice baths, and cryosprays. Muscle creams that cause a cold, tingly feeling do not offer the same benefits that ice offers and should not be used as a substitute unless recommended by your caregiver. °To make an ice pack, do one of the following: °· Place crushed ice or a   bag of frozen vegetables in a sealable plastic bag. Squeeze out the excess air. Place this bag inside another plastic bag. Slide the bag into a pillowcase or place a damp towel between your skin and the bag.  Mix 3 parts water with 1 part rubbing alcohol. Freeze the mixture in a sealable plastic bag. When you remove the mixture from the freezer, it will be slushy. Squeeze out the excess air. Place this bag inside another plastic bag. Slide the bag into a pillowcase or place a damp towel between your skin and the bag. SEEK MEDICAL CARE  IF:  You develop white spots on your skin. This may give the skin a blotchy (mottled) appearance.  Your skin turns blue or pale.  Your skin becomes waxy or hard.  Your swelling gets worse. MAKE SURE YOU:   Understand these instructions.  Will watch your condition.  Will get help right away if you are not doing well or get worse. Document Released: 10/24/2010 Document Revised: 07/14/2013 Document Reviewed: 10/24/2010 [facility] Patient Information 2015 [place], [place]. This information is not intended to replace advice given to you by your health care provider. Make sure you discuss any questions you have with your health care provider. Muscle Strain A muscle strain is an injury that occurs when a muscle is stretched beyond its normal length. Usually a small number of muscle fibers are torn when this happens. Muscle strain is rated in degrees. First-degree strains have the least amount of muscle fiber tearing and pain. Second-degree and third-degree strains have increasingly more tearing and pain.  Usually, recovery from muscle strain takes 1-2 weeks. Complete healing takes 5-6 weeks.  CAUSES  Muscle strain happens when a sudden, violent force placed on a muscle stretches it too far. This may occur with lifting, sports, or a fall.  RISK FACTORS Muscle strain is especially common in athletes.  SIGNS AND SYMPTOMS At the site of the muscle strain, there may be:  Pain.  Bruising.  Swelling.  Difficulty using the muscle due to pain or lack of normal function. DIAGNOSIS  Your health care provider will perform a physical exam and ask about your medical history. TREATMENT  Often, the best treatment for a muscle strain is resting, icing, and applying cold compresses to the injured area.  HOME CARE INSTRUCTIONS   Use the PRICE method of treatment to promote muscle healing during the first 2-3 days after your injury. The PRICE method involves:  Protecting the muscle from being injured  again.  Restricting your activity and resting the injured body part.  Icing your injury. To do this, put ice in a plastic bag. Place a towel between your skin and the bag. Then, apply the ice and leave it on from 15-20 minutes each hour. After the third day, switch to moist heat packs.  Apply compression to the injured area with a splint or elastic bandage. Be careful not to wrap it too tightly. This may interfere with blood circulation or increase swelling.  Elevate the injured body part above the level of your heart as often as you can.  Only take over-the-counter or prescription medicines for pain, discomfort, or fever as directed by your health care provider.  Warming up prior to exercise helps to prevent future muscle strains. SEEK MEDICAL CARE IF:   You have increasing pain or swelling in the injured area.  You have numbness, tingling, or a significant loss of strength in the injured area. MAKE SURE YOU:   Understand these instructions.  Will watch your condition.  Will get help right away if you are not doing well or get worse. Document Released: 02/27/2005 Document Revised: 12/18/2012 Document Reviewed: 09/26/2012 [facility] Patient Information 2015 [place], [place]. This information is not intended to replace advice given to you by your health care provider. Make sure you discuss any questions you have with your health care provider. Motor Vehicle Collision It is common to have multiple bruises and sore muscles after a motor vehicle collision (MVC). These tend to feel worse for the first 24 hours. You may have the most stiffness and soreness over the first several hours. You may also feel worse when you wake up the first morning after your collision. After this point, you will usually begin to improve with each day. The speed of improvement often depends on the severity of the collision, the number of injuries, and the location and nature of these injuries. HOME CARE  INSTRUCTIONS  Put ice on the injured area.  Put ice in a plastic bag.  Place a towel between your skin and the bag.  Leave the ice on for 15-20 minutes, 3-4 times a day, or as directed by your health care provider.  Drink enough fluids to keep your urine clear or pale yellow. Do not drink alcohol.  Take a warm shower or bath once or twice a day. This will increase blood flow to sore muscles.  You may return to activities as directed by your caregiver. Be careful when lifting, as this may aggravate neck or back pain.  Only take over-the-counter or prescription medicines for pain, discomfort, or fever as directed by your caregiver. Do not use aspirin. This may increase bruising and bleeding. SEEK IMMEDIATE MEDICAL CARE IF:  You have numbness, tingling, or weakness in the arms or legs.  You develop severe headaches not relieved with medicine.  You have severe neck pain, especially tenderness in the middle of the back of your neck.  You have changes in bowel or bladder control.  There is increasing pain in any area of the body.  You have shortness of breath, light-headedness, dizziness, or fainting.  You have chest pain.  You feel sick to your stomach (nauseous), throw up (vomit), or sweat.  You have increasing abdominal discomfort.  There is blood in your urine, stool, or vomit.  You have pain in your shoulder (shoulder strap areas).  You feel your symptoms are getting worse. MAKE SURE YOU:  Understand these instructions.  Will watch your condition.  Will get help right away if you are not doing well or get worse. Document Released: 02/27/2005 Document Revised: 07/14/2013 Document Reviewed: 07/27/2010 [facility] Patient Information 2015 [place], [place]. This information is not intended to replace advice given to you by your health care provider. Make sure you discuss any questions you have with your health care provider.
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
## 2016-06-22 ENCOUNTER — Other Ambulatory Visit (HOSPITAL_COMMUNITY): Payer: Self-pay | Admitting: Emergency Medicine

## 2016-06-22 ENCOUNTER — Encounter (HOSPITAL_COMMUNITY): Payer: Self-pay | Admitting: Emergency Medicine

## 2016-06-22 ENCOUNTER — Ambulatory Visit (HOSPITAL_COMMUNITY)
Admission: EM | Admit: 2016-06-22 | Discharge: 2016-06-22 | Disposition: A | Discharge status: Home / Self Care | Payer: Medicaid Other | Attending: Family Medicine | Admitting: Family Medicine

## 2016-06-22 DIAGNOSIS — N63 Unspecified lump in unspecified breast: Secondary | ICD-10-CM

## 2016-06-22 DIAGNOSIS — N6322 Unspecified lump in the left breast, upper inner quadrant: Secondary | ICD-10-CM

## 2016-06-22 DIAGNOSIS — Z3202 Encounter for pregnancy test, result negative: Secondary | ICD-10-CM

## 2016-06-22 LAB — POCT PREGNANCY, URINE: Preg Test, Ur: NEGATIVE

## 2016-06-22 NOTE — ED Provider Notes (Signed)
CSN: [id number]     Arrival date & time 06/22/16  1306 History   First MD Initiated Contact with Patient 06/22/16 1335     Chief Complaint  Patient presents with  . Breast Mass   (Consider location/radiation/quality/duration/timing/severity/associated sxs/prior Treatment) One month history of breast mass in her left breast. She states it has been painful, however it has not grown or changed in quality or nature. She has had no nipple discharge, no fever, no chills, no nausea, or no markers of systemic illness. She does state she had a miscarriage approximately 2 months ago.  Gynecologic history: age of menarche 19, irregular menstrual cycles, 2 pregnancies, 1 elective and 1 spontaneous abortions. No family history of breast cancer.   The history is provided by the patient.    Past Medical History:  Diagnosis Date  . BV (bacterial vaginosis)   . Chlamydia    at least 3 occasions, most recently 2013  . Obesity   . Ovarian cyst    Past Surgical History:  Procedure Laterality Date  . ANKLE SURGERY     Family History  Problem Relation Age of Onset  . Asthma Sister    Social History  Substance Use Topics  . Smoking status: Former Research scientist (life sciences)  . Smokeless tobacco: Never Used  . Alcohol use Yes     Comment: occasional   OB History    Gravida Para Term Preterm AB Living   0 0 0 0 0 0   SAB TAB Ectopic Multiple Live Births   0 0 0 0       Review of Systems  Constitutional: Negative for chills, fatigue and fever.  HENT: Negative.   Respiratory: Negative for cough, chest tightness and wheezing.   Cardiovascular: Negative for chest pain and palpitations.  Gastrointestinal: Negative for abdominal pain, diarrhea, nausea and vomiting.  Endocrine: Negative for cold intolerance and heat intolerance.  Genitourinary: Negative for dysuria, frequency and menstrual problem.  Musculoskeletal: Negative for back pain, neck pain and neck stiffness.  Skin: Negative.   Neurological: Negative  for dizziness, weakness and light-headedness.  Hematological: Negative for adenopathy. Does not bruise/bleed easily.  All other systems reviewed and are negative.   Allergies  Patient has no known allergies.  Home Medications   Prior to Admission medications   Medication Sig Start Date End Date Taking? Authorizing Provider  methocarbamol (ROBAXIN) 500 MG tablet Take 1 tablet (500 mg total) by mouth 2 (two) times daily. 10/04/14   Charlann Lange, PA-C   Meds Ordered and Administered this Visit  Medications - No data to display  BP 131/89 (BP Location: Left Arm)   Pulse 78   Temp 98.7 F (37.1 C) (Oral)   Resp 16   LMP 06/21/2016   SpO2 99%  No data found.   Physical Exam  Constitutional: She is oriented to person, place, and time. She appears well-developed and well-nourished.  HENT:  Head: Normocephalic and atraumatic.  Right Ear: External ear normal.  Left Ear: External ear normal.  Eyes: Conjunctivae are normal. Right eye exhibits no discharge. Left eye exhibits no discharge.  Cardiovascular: Normal rate and regular rhythm.   Pulmonary/Chest: Effort normal and breath sounds normal. She exhibits mass. Right breast exhibits no inverted nipple, no nipple discharge, no skin change and no tenderness. Left breast exhibits mass. Left breast exhibits no inverted nipple, no nipple discharge, no skin change and no tenderness. Breasts are symmetrical.    Abdominal: Soft. Bowel sounds are normal.  Neurological: She is alert  and oriented to person, place, and time.  Skin: Skin is warm and dry. Capillary refill takes less than 2 seconds.  Psychiatric: She has a normal mood and affect. Her behavior is normal.  Nursing note and vitals reviewed.   Urgent Care Course     Procedures (including critical care time)  Labs Review Labs Reviewed  POCT PREGNANCY, URINE    Imaging Review No results found.     MDM   1. Breast mass in female    Referral made to the breast center,  along with order faxed over for diagnostic mammogram, and ultrasound if indicated. Encouraged the patient to contact the breast Center as soon as possible to have this procedure  scheduled     Barnet Glasgow, NP 06/22/16 1551

## 2016-06-22 NOTE — Discharge Instructions (Signed)
A diagnostic mammogram with follow-up ultrasound has been ordered. Disorder has been sent to the breast center of [facility], and he had been provided with the original paperwork. Call the breast center as soon as you leave here to schedule an appointment for this mammogram.

## 2016-06-22 NOTE — ED Triage Notes (Signed)
Here for breast mass on left side onset 1 month associated w/tenderness  Denies nipple d/c, fevers, swelling  A&O x4... NAD

## 2016-07-25 ENCOUNTER — Telehealth (HOSPITAL_COMMUNITY): Payer: Self-pay | Admitting: *Deleted

## 2016-07-25 NOTE — Telephone Encounter (Signed)
Telephoned patient at home number and left message to return call to [facility] 

## 2017-03-28 ENCOUNTER — Other Ambulatory Visit: Payer: Self-pay

## 2017-04-02 ENCOUNTER — Inpatient Hospital Stay
Admission: RE | Admit: 2017-04-02 | Discharge: 2017-04-02 | Disposition: A | Discharge status: Home / Self Care | Payer: Self-pay | Source: Ambulatory Visit | Attending: Emergency Medicine | Admitting: Emergency Medicine

## 2017-04-02 ENCOUNTER — Ambulatory Visit
Admission: RE | Admit: 2017-04-02 | Discharge: 2017-04-02 | Disposition: A | Discharge status: Home / Self Care | Payer: BLUE CROSS/BLUE SHIELD | Source: Ambulatory Visit | Attending: Emergency Medicine | Admitting: Emergency Medicine

## 2017-04-02 DIAGNOSIS — N63 Unspecified lump in unspecified breast: Secondary | ICD-10-CM

## 2017-07-26 ENCOUNTER — Emergency Department (HOSPITAL_COMMUNITY): Payer: Self-pay

## 2017-07-26 ENCOUNTER — Other Ambulatory Visit: Payer: Self-pay

## 2017-07-26 ENCOUNTER — Emergency Department (HOSPITAL_COMMUNITY)
Admission: EM | Admit: 2017-07-26 | Discharge: 2017-07-27 | Disposition: A | Discharge status: Home / Self Care | Payer: Self-pay | Attending: Emergency Medicine | Admitting: Emergency Medicine

## 2017-07-26 ENCOUNTER — Encounter (HOSPITAL_COMMUNITY): Payer: Self-pay | Admitting: Emergency Medicine

## 2017-07-26 DIAGNOSIS — G8929 Other chronic pain: Secondary | ICD-10-CM | POA: Insufficient documentation

## 2017-07-26 DIAGNOSIS — Z87891 Personal history of nicotine dependence: Secondary | ICD-10-CM | POA: Insufficient documentation

## 2017-07-26 DIAGNOSIS — N644 Mastodynia: Secondary | ICD-10-CM | POA: Insufficient documentation

## 2017-07-26 NOTE — ED Triage Notes (Signed)
Pt from home with c/o left breast pain that has been present intermittently x 1 year. Pt states this episode has been ongoing x 3 months. Pt states she has seen PCP who sent her to imaging. PCP stated he felt a lump but all images came up negative. Pt states she has maternal family hx of breast cancer which is her main concern

## 2017-07-26 NOTE — Discharge Instructions (Addendum)
1. Medications: tylenol or ibuprofen for pain, usual home medications 2. Treatment: rest, drink plenty of fluids,  3. Follow Up: Please followup with OB/GYN in 1-3 weeks for discussion of your diagnoses and further evaluation after today's visit; if you do not have a primary care doctor use the resource guide provided to find one; Please return to the ER for worsening symptoms, difficulty breathing, high fevers, persistent vomiting or other concerns

## 2017-07-26 NOTE — ED Notes (Signed)
Last MC ended 07/03/17, pt states she is having normal periods

## 2017-07-26 NOTE — ED Provider Notes (Signed)
[facility] DEPT Provider Note   CSN: [id number] Arrival date & time: 07/26/17  1909     History   Chief Complaint No chief complaint on file.   HPI [place] is a 27 y.o. female G1P0010 with a hx of BV, chlamydia, obesity, ovarian cyst presents to the Emergency Department complaining of intermittent left breast pain onset 1 year ago.  Pt reports the pain waxes and wanes, but does not resolve.  She reports the pain got worse earlier today without prompting.  Pt reports she was evaluated by her PCP who referred pt for imaging.  Per patient, imaging was negative, but she continues to be concerned about the possibility of cancer.  Pt reports a hx of breast cancer in her 75yo aunt.  No hx of breast cancer in mother or grandmother.  Pt reports nothing makes the pain better or worse.  Pain is not pleuritic in nature.  She denies fever, chills, headache, neck pain, SOB, abd pain, N/V/D, weakness, dizziness, syncope, dysuria, weight loss, night sweats.  LMP: 07/03/17  Record review shows that pt was seen in April 2018 at [facility] and was referred to [facility] imaging.  Record review shows that pt did have an [place] in January. This showed no lump or mass.  Pt reports she does not have a PCP or OB/GYN in the area.     The history is provided by the patient and medical records. No language interpreter was used.    Past Medical History:  Diagnosis Date  . BV (bacterial vaginosis)   . Chlamydia    at least 3 occasions, most recently 2013  . Obesity   . Ovarian cyst     Patient Active Problem List   Diagnosis Date Noted  . Contraception management 09/12/2010    Past Surgical History:  Procedure Laterality Date  . ANKLE SURGERY       OB History    Gravida  0   Para  0   Term  0   Preterm  0   AB  0   Living  0     SAB  0   TAB  0   Ectopic  0   Multiple  0   Live Births               Home Medications    Prior to Admission  medications   Medication Sig Start Date End Date Taking? Authorizing Provider  methocarbamol (ROBAXIN) 500 MG tablet Take 1 tablet (500 mg total) by mouth 2 (two) times daily. Patient not taking: Reported on 07/26/2017 10/04/14   Charlann Lange, PA-C    Family History Family History  Problem Relation Age of Onset  . Asthma Sister   . Breast cancer Maternal Aunt     Social History Social History   Tobacco Use  . Smoking status: Former Research scientist (life sciences)  . Smokeless tobacco: Never Used  Substance Use Topics  . Alcohol use: Yes    Comment: occasional  . Drug use: No     Allergies   Patient has no known allergies.   Review of Systems Review of Systems  Constitutional: Negative for appetite change, diaphoresis, fatigue, fever and unexpected weight change.  HENT: Negative for mouth sores.   Eyes: Negative for visual disturbance.  Respiratory: Negative for cough, chest tightness, shortness of breath and wheezing.   Cardiovascular: Positive for chest pain (left breast pain).  Gastrointestinal: Negative for abdominal pain, constipation, diarrhea, nausea and vomiting.  Endocrine:  Negative for polydipsia, polyphagia and polyuria.  Genitourinary: Negative for dysuria, frequency, hematuria and urgency.  Musculoskeletal: Negative for back pain and neck stiffness.  Skin: Negative for rash.  Allergic/Immunologic: Negative for immunocompromised state.  Neurological: Negative for syncope, light-headedness and headaches.  Hematological: Does not bruise/bleed easily.  Psychiatric/Behavioral: Negative for sleep disturbance. The patient is not nervous/anxious.      Physical Exam Updated Vital Signs BP (!) 135/99 (BP Location: Left Arm)   Pulse 77   Temp 99.4 F (37.4 C) (Oral)   Resp 15   Ht 5\' 5"  (1.651 m)   Wt 68 kg (150 lb)   SpO2 100%   BMI 24.96 kg/m   Physical Exam  Constitutional: She appears well-developed and well-nourished. No distress.  Awake, alert, nontoxic appearance    HENT:  Head: Normocephalic and atraumatic.  Mouth/Throat: Oropharynx is clear and moist. No oropharyngeal exudate.  Eyes: Conjunctivae are normal. No scleral icterus.  Neck: Normal range of motion. Neck supple.  Cardiovascular: Normal rate, regular rhythm and intact distal pulses.  Pulmonary/Chest: Effort normal and breath sounds normal. No respiratory distress. She has no wheezes. Right breast exhibits no mass, no nipple discharge, no skin change and no tenderness. Left breast exhibits tenderness. Left breast exhibits no mass, no nipple discharge and no skin change. No breast bleeding. Breasts are symmetrical.  Equal chest expansion Left breast with tenderness to palpation in the upper inner quadrant.  Breast tissue is palpable here without discrete mass.  No erythema or ulceration to the tissue.   Abdominal: Soft. Bowel sounds are normal. She exhibits no mass. There is no tenderness. There is no rebound and no guarding.  Musculoskeletal: Normal range of motion. She exhibits no edema.  Neurological: She is alert.  Speech is clear and goal oriented Moves extremities without ataxia  Skin: Skin is warm and dry. She is not diaphoretic.  Psychiatric: She has a normal mood and affect.  Nursing note and vitals reviewed.    ED Treatments / Results   Radiology Dg Chest 2 View  Result Date: 07/26/2017 CLINICAL DATA:  LEFT breast enlargement with pain and palpable nodules. Strong family history of breast cancer. EXAM: CHEST - 2 VIEW COMPARISON:  None. FINDINGS: Heart size and mediastinal contours are within normal limits. Lungs are clear. Lung volumes are normal. No pleural effusion or pneumothorax seen. Osseous structures about the chest are unremarkable. IMPRESSION: No active cardiopulmonary disease. Electronically Signed   By: Franki Cabot M.D.   On: 07/26/2017 23:31    Procedures Procedures (including critical care time)  Medications Ordered in ED Medications - No data to  display   Initial Impression / Assessment and Plan / ED Course  I have reviewed the triage vital signs and the nursing notes.  Pertinent labs & imaging results that were available during my care of the patient were reviewed by me and considered in my medical decision making (see chart for details).  Clinical Course as of Jul 26 2357  Thu Jul 26, 2017  2219 Left Breast US 04/02/17: EXAM: ULTRASOUND OF THE LEFT BREAST  COMPARISON:  Previous exam(s).  FINDINGS: On physical exam, I palpate a prominent rib along the anteromedial chest wall at the 10 o'clock position.  Targeted ultrasound is performed, showing normal fibroglandular tissue with normal underlying muscle and rib at the 10 o'clock position 9 cm from the nipple. No suspicious or focal sonographic findings are identified.  IMPRESSION: Prominent left chest wall rib corresponding with the patient's palpable abnormality. No  other focal or suspicious sonographic findings are identified.  RECOMMENDATION: Clinical follow-up recommended for the area of concern in the left breast. Any further workup should be based on clinical grounds.  Screening mammogram at age 64 unless there are persistent or intervening clinical concerns. (Code:SM-B-40A)  I have discussed the findings and recommendations with the patient. Results were also provided in writing at the conclusion of the visit. If applicable, a reminder letter will be sent to the patient regarding the next appointment.  BI-RADS CATEGORY  1: Negative.   Electronically Signed   By: Kristopher Oppenheim M.D.   On: 04/02/2017 08:37   [HM]    Clinical Course User Index [HM] Kimoni Pickerill, Gwenlyn Perking    Presents with left breast pain for the last year.  She is very concerned about this.  Record review shows that patient did have a breast ultrasound in January 2019 without abnormal findings.  No masses or concerning cystic areas.  On exam today, patient is without  nipple discharge, skin changes or palpable mass.  No bleeding noted from the breast.  Patient's chest pain is not pleuritic, she has no shortness of breath, hemoptysis or risk factors for pulmonary embolism.  She is not tachycardic today highly doubt PE.  Patient is without personal or family cardiac history.  Highly doubt chest pain is cardiac in nature.  She has not had any recent illnesses to suggest myocarditis or pericarditis.  On exam her pain is located in the left upper inner quadrant of her breast and is reproducible with pain.  She will be referred to OB/GYN for further evaluation and serial breast exams.  Patient states understanding and is in agreement with the plan.    Final Clinical Impressions(s) / ED Diagnoses   Final diagnoses:  Breast pain, left    ED Discharge Orders    None       Brya Simerly, Gwenlyn Perking 07/27/17 [place], [place], DO 07/27/17 1633

## 2018-02-06 ENCOUNTER — Ambulatory Visit: Payer: Self-pay | Admitting: Family Medicine

## 2018-02-15 ENCOUNTER — Encounter: Payer: Self-pay | Admitting: Family Medicine

## 2018-02-15 ENCOUNTER — Ambulatory Visit (INDEPENDENT_AMBULATORY_CARE_PROVIDER_SITE_OTHER): Payer: Self-pay | Admitting: Family Medicine

## 2018-02-15 VITALS — BP 102/70 | Temp 97.6°F | Wt 159.0 lb

## 2018-02-15 DIAGNOSIS — N6320 Unspecified lump in the left breast, unspecified quadrant: Secondary | ICD-10-CM

## 2018-02-15 DIAGNOSIS — Z7689 Persons encountering health services in other specified circumstances: Secondary | ICD-10-CM

## 2018-02-15 NOTE — Progress Notes (Signed)
Patient presents to clinic today to establish care.  SUBJECTIVE: PMH:  Pt is a 27 yo female with no sig pmh.  Pt was previously seen in [place], [place], but recently moved back to the area.  Breast concern: -notes small lump in L breast x 1 yr -also has bump in L axilla x ~6 mnths -endorses intermittent pain in L breast at lump -LMP 2 wks ago.   -not on birth control.  Regular menses -endorses nipple inversion at baseline. -denies erythema, nipple d/c -had u/s in the past which was negative -pt expresses frustration that no one seemed to take her concerns seriously.   -Pt's mom dx'd with breast cancer at age 67  Allergies: Past h/o itching with scallops, but recently had shellfish without issue.  Social hx: Pt is single.  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  Pt went to [employer].  Pt endorses social EtOH use.  Pt was a social smoker in college, but has quit.  Pt endorses former marijuana use.   Health Maintenance: Dental -- needs to schedule appt  Past Medical History:  Diagnosis Date  . BV (bacterial vaginosis)   . Chlamydia    at least 3 occasions, most recently 2013  . Obesity   . Ovarian cyst     Past Surgical History:  Procedure Laterality Date  . ANKLE SURGERY      No current outpatient medications on file prior to visit.   No current facility-administered medications on file prior to visit.     No Known Allergies  Family History  Problem Relation Age of Onset  . Asthma Sister   . Breast cancer Maternal Aunt     Social History   Socioeconomic History  . Marital status: Single    Spouse name: Not on file  . Number of children: Not on file  . Years of education: Not on file  . Highest education level: Not on file  Occupational History  . Not on file  Social Needs  . Financial resource strain: Not on file  . Food insecurity:    Worry: Not on file    Inability: Not on file  . Transportation needs:    Medical: Not on file   Non-medical: Not on file  Tobacco Use  . Smoking status: Former Research scientist (life sciences)  . Smokeless tobacco: Never Used  Substance and Sexual Activity  . Alcohol use: Yes    Comment: occasional  . Drug use: No  . Sexual activity: Yes    Birth control/protection: IUD  Lifestyle  . Physical activity:    Days per week: Not on file    Minutes per session: Not on file  . Stress: Not on file  Relationships  . Social connections:    Talks on phone: Not on file    Gets together: Not on file    Attends religious service: Not on file    Active member of club or organization: Not on file    Attends meetings of clubs or organizations: Not on file    Relationship status: Not on file  . Intimate partner violence:    Fear of current or ex partner: Not on file    Emotionally abused: Not on file    Physically abused: Not on file    Forced sexual activity: Not on file  Other Topics Concern  . Not on file  Social History Narrative  . Not on file    ROS General: Denies fever, chills,  night sweats, changes in weight, changes in appetite HEENT: Denies headaches, ear pain, changes in vision, rhinorrhea, sore throat CV: Denies CP, palpitations, SOB, orthopnea Pulm: Denies SOB, cough, wheezing GI: Denies abdominal pain, nausea, vomiting, diarrhea, constipation GU: Denies dysuria, hematuria, frequency, vaginal discharge  +mass in L breast Msk: Denies muscle cramps, joint pains Neuro: Denies weakness, numbness, tingling Skin: Denies rashes, bruising Psych: Denies depression, anxiety, hallucinations  Temp 97.6 F (36.4 C) (Oral)   Wt 159 lb (72.1 kg)   BMI 26.46 kg/m   Physical Exam Gen. Pleasant, well developed, well-nourished, in NAD HEENT - [place]/AT, PERRL, no scleral icterus, no nasal drainage, Dental carries noted.  pharynx without erythema or exudate. Lungs: no use of accessory muscles, CTAB, no wheezes, rales or rhonchi Cardiovascular: RRR, No r/g/m, no peripheral edema Breast-bilateral breast with  nipple inversion otherwise normal in appearance.  No masses appreciated in the right breast or axilla.  Left breast palpable sized round mobile mass 6 mm in left upper quadrant.  A similar lesion noted in left lower quadrant of left breast.  No lesions noted in axilla, no peau d'orange, no erythema, no nipple discharge.  Chaperone present: Army [place], CMA Neuro:  A&Ox3, CN II-XII intact, normal gait Skin:  Warm, dry, intact, no lesions  No results found for this or any previous visit (from the past 2160 hour(s)).  Assessment/Plan: Breast mass, left  -Discussed possible causes including cyst -Given handout -Referral placed for [facility] - Plan: US BREAST LTD UNI LEFT INC AXILLA  Encounter to establish care -We reviewed the PMH, PSH, FH, SH, Meds and Allergies. -We provided refills for any medications we will prescribe as needed. -We addressed current concerns per orders and patient instructions. -We have asked for records for pertinent exams, studies, vaccines and notes from previous providers. -We have advised patient to follow up per instructions below.   F/u prn  Grier Mitts, MD

## 2018-02-15 NOTE — Patient Instructions (Signed)
Breast Cyst A breast cyst is a sac in the breast that is filled with fluid. Breast cysts are usually noncancerous (benign). They are common among women, and they are most often located in the upper, outer portion of the breast. One or more cysts may develop. They form when fluid builds up inside of the breast glands. There are several types of breast cysts:  Macrocyst. This is a cyst that is about 2 inches (5.1 cm) across (in diameter).  Microcyst. This is a very small cyst that you cannot feel, but it can be seen with imaging tests such as an X-ray of the breast (mammogram) or ultrasound.  Galactocele. This is a cyst that contains milk. It may develop if you suddenly stop breastfeeding.  Breast cysts do not increase your risk of breast cancer. They usually disappear after menopause, unless you take artificial hormones (are on hormone therapy). What are the causes? The exact cause of breast cysts is not known. Possible causes include:  Blockage of tubes (ducts) in the breast glands, which leads to fluid buildup. Duct blockage may result from: ? Fibrocystic breast changes. This is a common, benign condition that occurs when women go through hormonal changes during the menstrual cycle. This is a common cause of multiple breast cysts. ? Overgrowth of breast tissue or breast glands. ? Scar tissue in the breast from previous surgery.  Changes in certain female hormones (estrogen and progesterone).  What increases the risk? You may be more likely to develop breast cysts if you have not gone through menopause. What are the signs or symptoms? Symptoms of a breast cyst may include:  Feeling one or more smooth, round, soft lumps (like grapes) in the breast that are easily moveable. The lump(s) may get bigger and more painful before your period and get smaller after your period.  Breast discomfort or pain.  How is this diagnosed? A cyst can be felt during a physical exam by your health care  provider. A mammogram and ultrasound will be done to confirm the diagnosis. Fluid may be removed from the cyst with a needle (fine-needle aspiration) and tested to make sure the cyst is not cancerous. How is this treated? Treatment may not be necessary. Your health care provider may monitor the cyst to see if it goes away on its own. If the cyst is uncomfortable or gets bigger, or if you do not like how the cyst makes your breast look, you may need treatment. Treatment may include:  Hormone treatment.  Fine-needle aspiration, to drain fluid from the cyst. There is a chance of the cyst coming back (recurring) after aspiration.  Surgery to remove the cyst.  Follow these instructions at home:  See your health care provider regularly. ? Get a yearly physical exam. ? If you are 20-40 years old, get a clinical breast exam every 1-3 years. After age 40, get this exam every year. ? Get mammograms as often as directed.  Do a breast self-exam every month, or as often as directed. Having many breast cysts, or "lumpy" breasts, may make it harder to feel for new lumps. Understand how your breasts normally look and feel, and write down any changes in your breasts so you can tell your health care provider about the changes. A breast self-exam involves: ? Comparing your breasts in the mirror. ? Looking for visible changes in your skin or nipples. ? Feeling for lumps or changes.  Take over-the-counter and prescription medicines only as told by your health care   provider.  Wear a supportive bra, especially when exercising.  Follow instructions from your health care provider about eating and drinking restrictions. ? Avoid caffeine. ? Cut down on salt (sodium) in what you eat and drink, especially before your menstrual period. Too much sodium can cause fluid buildup (retention), breast swelling, and discomfort.  Keep all follow-up visits as told your health care provider. This is important. Contact a  health care provider if:  You feel, or think you feel, a lump in your breast.  You notice that both breasts look or feel different than usual.  Your breast is still causing pain after your menstrual period is over.  You find new lumps or bumps that were not there before.  You feel lumps in your armpit (axilla). Get help right away if:  You have severe pain, tenderness, redness, or warmth in your breast.  You have fluid or blood leaking from your nipple.  Your breast lump becomes hard and painful.  You notice dimpling or wrinkling of the breast or nipple. This information is not intended to replace advice given to you by your health care provider. Make sure you discuss any questions you have with your health care provider. Document Released: 02/27/2005 Document Revised: 11/19/2015 Document Reviewed: 11/19/2015 Elsevier Interactive Patient Education  2017 Elsevier Inc.  

## 2018-03-19 LAB — HM MAMMOGRAPHY

## 2018-06-11 ENCOUNTER — Encounter: Payer: Self-pay | Admitting: Family Medicine

## 2018-07-17 ENCOUNTER — Telehealth: Payer: Self-pay | Admitting: *Deleted

## 2018-07-17 NOTE — Telephone Encounter (Signed)
Copied from [facility] [phone]. Topic: Appointment Scheduling - Scheduling Inquiry for Clinic >> Jul 16, 2018  4:26 PM Sheran Luz wrote: Reason for CRM: Patient calling after hours to request CPE appointment. She is requesting appointment for STD testing, pap and possibly a tetanus vaccination.

## 2018-07-17 NOTE — Telephone Encounter (Signed)
Called pt and no answer or answering machine to lmom to call back to schedule appointment.

## 2018-07-22 ENCOUNTER — Telehealth: Payer: Self-pay

## 2018-07-22 NOTE — Telephone Encounter (Signed)
Patient called needing to schedule an in office visit for a vaginal exam, she stated not exactly a PAP. I am not sure when Dr. Volanda Napoleon will be in the office. Can you schedule this exam for patient? Thank you!

## 2018-07-29 NOTE — Telephone Encounter (Addendum)
Pt called to follow up on request to have in office vaginal exam. She would like to know if she can see Dr. Volanda Napoleon or another provider in office and if not could Dr. Volanda Napoleon refer her to a gynecologist. Please advise. New number has been updated. [phone]

## 2018-07-30 NOTE — Telephone Encounter (Signed)
Please advice if ok to sent referral

## 2018-07-31 ENCOUNTER — Encounter: Payer: Self-pay | Admitting: Obstetrics and Gynecology

## 2018-08-01 NOTE — Telephone Encounter (Signed)
Pt technically does not need a referral for OB/Gyn. She can call and make appointment.

## 2018-08-02 NOTE — Telephone Encounter (Signed)
Pt is scheduled for an OV on 08/07/2018 at 10.30 am

## 2018-08-07 ENCOUNTER — Ambulatory Visit (INDEPENDENT_AMBULATORY_CARE_PROVIDER_SITE_OTHER): Payer: BLUE CROSS/BLUE SHIELD | Admitting: Family Medicine

## 2018-08-07 ENCOUNTER — Other Ambulatory Visit: Payer: Self-pay

## 2018-08-07 ENCOUNTER — Encounter: Payer: Self-pay | Admitting: Family Medicine

## 2018-08-07 ENCOUNTER — Other Ambulatory Visit (HOSPITAL_COMMUNITY)
Admission: RE | Admit: 2018-08-07 | Discharge: 2018-08-07 | Disposition: A | Discharge status: Home / Self Care | Payer: BLUE CROSS/BLUE SHIELD | Source: Ambulatory Visit | Attending: Family Medicine | Admitting: Family Medicine

## 2018-08-07 VITALS — BP 120/80 | HR 70 | Temp 98.2°F | Wt 160.0 lb

## 2018-08-07 DIAGNOSIS — Z0000 Encounter for general adult medical examination without abnormal findings: Secondary | ICD-10-CM

## 2018-08-07 DIAGNOSIS — Z113 Encounter for screening for infections with a predominantly sexual mode of transmission: Secondary | ICD-10-CM

## 2018-08-07 DIAGNOSIS — L680 Hirsutism: Secondary | ICD-10-CM | POA: Diagnosis not present

## 2018-08-07 LAB — HEMOGLOBIN A1C: Hgb A1c MFr Bld: 5.8 % (ref 4.6–6.5)

## 2018-08-07 LAB — TSH: TSH: 0.8 u[IU]/mL (ref 0.35–4.50)

## 2018-08-07 LAB — T4, FREE: Free T4: 0.94 ng/dL (ref 0.60–1.60)

## 2018-08-07 NOTE — Progress Notes (Signed)
Subjective:     Janice Chan is a 28 y.o. female and is here for a comprehensive physical exam. The patient reports problems - was having increased vaginal d/c at time appt was made, has since resolved.  Pt states she recently ended a 2 yr relationship.  Pt's bf moved out and she is dealing with the breakup.    Pt is unsure of last pap.  Social History   Socioeconomic History  . Marital status: Single    Spouse name: Not on file  . Number of children: Not on file  . Years of education: Not on file  . Highest education level: Not on file  Occupational History  . Not on file  Social Needs  . Financial resource strain: Not on file  . Food insecurity:    Worry: Not on file    Inability: Not on file  . Transportation needs:    Medical: Not on file    Non-medical: Not on file  Tobacco Use  . Smoking status: Former Research scientist (life sciences)  . Smokeless tobacco: Never Used  Substance and Sexual Activity  . Alcohol use: Yes    Comment: occasional  . Drug use: No  . Sexual activity: Yes    Birth control/protection: I.U.D.  Lifestyle  . Physical activity:    Days per week: Not on file    Minutes per session: Not on file  . Stress: Not on file  Relationships  . Social connections:    Talks on phone: Not on file    Gets together: Not on file    Attends religious service: Not on file    Active member of club or organization: Not on file    Attends meetings of clubs or organizations: Not on file    Relationship status: Not on file  . Intimate partner violence:    Fear of current or ex partner: Not on file    Emotionally abused: Not on file    Physically abused: Not on file    Forced sexual activity: Not on file  Other Topics Concern  . Not on file  Social History Narrative  . Not on file   Health Maintenance  Topic Date Due  . TETANUS/TDAP  06/12/2010  . PAP-Cervical Cytology Screening  06/11/2012  . PAP SMEAR-Modifier  06/11/2012  . INFLUENZA VACCINE  10/12/2018  . HIV Screening   Completed    The following portions of the patient's history were reviewed and updated as appropriate: allergies, current medications, past family history, past medical history, past social history, past surgical history and problem list.  Review of Systems Pertinent items noted in HPI and remainder of comprehensive ROS otherwise negative.   Objective:    BP 120/80 (BP Location: Left Arm, Patient Position: Sitting, Cuff Size: Normal)   Pulse 70   Temp 98.2 F (36.8 C)   Wt 160 lb (72.6 kg)   SpO2 98%   BMI 26.63 kg/m  General appearance: alert, cooperative, appears stated age and no distress Head: Normocephalic, without obvious abnormality, atraumatic Eyes: conjunctivae/corneas clear. PERRL, EOM's intact. Fundi benign. Ears: normal TM's and external ear canals both ears Nose: Nares normal. Septum midline. Mucosa normal. No drainage or sinus tenderness. Throat: lips, mucosa, and tongue normal; teeth and gums normal Neck: no adenopathy, no carotid bruit, no JVD, supple, symmetrical, trachea midline and R lobe of thyroid mildy enlagred compared to L.  hirsutism of chin. Lungs: clear to auscultation bilaterally Heart: regular rate and rhythm, S1, S2 normal, no murmur,  click, rub or gallop Abdomen: soft, non-tender; bowel sounds normal; no masses,  no organomegaly Pelvic: cervix normal in appearance, external genitalia normal, no adnexal masses or tenderness, no cervical motion tenderness, rectovaginal septum normal and vagina normal without discharge Extremities: extremities normal, atraumatic, no cyanosis or edema Skin: Skin color, texture, turgor normal. No rashes or lesions Lymph nodes: Cervical, supraclavicular, and axillary nodes normal. Neurologic: Alert and oriented X 3, normal strength and tone. Normal symmetric reflexes. Normal coordination and gait    Assessment:    Healthy female exam.      Plan:     Anticipatory guidance given including wearing seatbelts, smoke  detectors in the home, increasing physical activity, increasing p.o. intake of water and vegetables. -will obtain labs including STI testing -pap done this visit. See After Visit Summary for Counseling Recommendations    Hirsutism -will obtain Hgb A1C, thyroid labs.  F/u prn  Grier Mitts, MD

## 2018-08-07 NOTE — Patient Instructions (Signed)
Preventive Care 18-39 Years, Female Preventive care refers to lifestyle choices and visits with your health care provider that can promote health and wellness. What does preventive care include?   A yearly physical exam. This is also called an annual well check.  Dental exams once or twice a year.  Routine eye exams. Ask your health care provider how often you should have your eyes checked.  Personal lifestyle choices, including: ? Daily care of your teeth and gums. ? Regular physical activity. ? Eating a healthy diet. ? Avoiding tobacco and drug use. ? Limiting alcohol use. ? Practicing safe sex. ? Taking vitamin and mineral supplements as recommended by your health care provider. What happens during an annual well check? The services and screenings done by your health care provider during your annual well check will depend on your age, overall health, lifestyle risk factors, and family history of disease. Counseling Your health care provider may ask you questions about your:  Alcohol use.  Tobacco use.  Drug use.  Emotional well-being.  Home and relationship well-being.  Sexual activity.  Eating habits.  Work and work environment.  Method of birth control.  Menstrual cycle.  Pregnancy history. Screening You may have the following tests or measurements:  Height, weight, and BMI.  Diabetes screening. This is done by checking your blood sugar (glucose) after you have not eaten for a while (fasting).  Blood pressure.  Lipid and cholesterol levels. These may be checked every 5 years starting at age 20.  Skin check.  Hepatitis C blood test.  Hepatitis B blood test.  Sexually transmitted disease (STD) testing.  BRCA-related cancer screening. This may be done if you have a family history of breast, ovarian, tubal, or peritoneal cancers.  Pelvic exam and Pap test. This may be done every 3 years starting at age 21. Starting at age 30, this may be done every 5  years if you have a Pap test in combination with an HPV test. Discuss your test results, treatment options, and if necessary, the need for more tests with your health care provider. Vaccines Your health care provider may recommend certain vaccines, such as:  Influenza vaccine. This is recommended every year.  Tetanus, diphtheria, and acellular pertussis (Tdap, Td) vaccine. You may need a Td booster every 10 years.  Varicella vaccine. You may need this if you have not been vaccinated.  HPV vaccine. If you are 26 or younger, you may need three doses over 6 months.  Measles, mumps, and rubella (MMR) vaccine. You may need at least one dose of MMR. You may also need a second dose.  Pneumococcal 13-valent conjugate (PCV13) vaccine. You may need this if you have certain conditions and were not previously vaccinated.  Pneumococcal polysaccharide (PPSV23) vaccine. You may need one or two doses if you smoke cigarettes or if you have certain conditions.  Meningococcal vaccine. One dose is recommended if you are age 19-21 years and a first-year college student living in a residence hall, or if you have one of several medical conditions. You may also need additional booster doses.  Hepatitis A vaccine. You may need this if you have certain conditions or if you travel or work in places where you may be exposed to hepatitis A.  Hepatitis B vaccine. You may need this if you have certain conditions or if you travel or work in places where you may be exposed to hepatitis B.  Haemophilus influenzae type b (Hib) vaccine. You may need this if you   have certain risk factors. Talk to your health care provider about which screenings and vaccines you need and how often you need them. This information is not intended to replace advice given to you by your health care provider. Make sure you discuss any questions you have with your health care provider. Document Released: 04/25/2001 Document Revised: 10/10/2016  Document Reviewed: 12/29/2014 Elsevier Interactive Patient Education  2019 [employer] Sex Practicing safe sex means taking steps before and during sex to reduce your risk of:  Getting an STD (sexually transmitted disease).  Giving your partner an STD.  Unwanted pregnancy. How can I practice safe sex? To practice safe sex:  Limit your sexual partners to only one partner who is having sex with only you.  Avoid using alcohol and recreational drugs before having sex. These substances can affect your judgment.  Before having sex with a new partner: ? Talk to your partner about past partners, past STDs, and drug use. ? You and your partner should be screened for STDs and discuss the results with each other.  Check your body regularly for sores, blisters, rashes, or unusual discharge. If you notice any of these problems, visit your health care provider.  If you have symptoms of an infection or you are being treated for an STD, avoid sexual contact.  While having sex, use a condom. Make sure to: ? Use a condom every time you have vaginal, oral, or anal sex. Both females and males should wear condoms during oral sex. ? Keep condoms in place from the beginning to the end of sexual activity. ? Use a latex condom, if possible. Latex condoms offer the best protection. ? Use only water-based lubricants or oils to lubricate a condom. Using petroleum-based lubricants or oils will weaken the condom and increase the chance that it will break.  See your health care provider for regular screenings, exams, and tests for STDs.  Talk with your health care provider about the form of birth control (contraception) that is best for you.  Get vaccinated against hepatitis B and human papillomavirus (HPV).  If you are at risk of being infected with HIV (human immunodeficiency virus), talk with your health care provider about taking a prescription medicine to prevent HIV infection. You are  considered at risk for HIV if: ? You are a man who has sex with other men. ? You are a heterosexual man or woman who is sexually active with more than one partner. ? You take drugs by injection. ? You are sexually active with a partner who has HIV. This information is not intended to replace advice given to you by your health care provider. Make sure you discuss any questions you have with your health care provider. Document Released: 04/06/2004 Document Revised: 07/14/2015 Document Reviewed: 01/17/2015 Elsevier Interactive Patient Education  2019 [employer].

## 2018-08-08 LAB — RPR: RPR Ser Ql: NONREACTIVE

## 2018-08-08 LAB — HIV ANTIBODY (ROUTINE TESTING W REFLEX): HIV 1&2 Ab, 4th Generation: NONREACTIVE

## 2018-08-09 LAB — CYTOLOGY - PAP
Chlamydia: NEGATIVE
Diagnosis: NEGATIVE
Neisseria Gonorrhea: NEGATIVE
Trichomonas: NEGATIVE

## 2018-08-14 ENCOUNTER — Telehealth: Payer: Self-pay | Admitting: *Deleted

## 2018-08-14 NOTE — Telephone Encounter (Signed)
Copied from [place] [phone]. Topic: General - Other >> Aug 14, 2018  1:01 PM Nils Flack wrote: Reason for [phone] Pt returning call, she thinks it is about labs. Please call

## 2018-08-16 ENCOUNTER — Telehealth: Payer: Self-pay | Admitting: *Deleted

## 2018-08-16 NOTE — Telephone Encounter (Signed)
Tried to return pt call not able to leave message pt voicemail has not been set up

## 2018-08-16 NOTE — Telephone Encounter (Signed)
Copied from [place] [id number]. Topic: General - Call Back - No Documentation >> Aug 16, 2018  1:44 PM Erick Blinks wrote: Pt has a meeting 3:45, please call anytime before. [phone] requesting call back

## 2018-08-19 NOTE — Telephone Encounter (Signed)
Lab results given

## 2018-08-19 NOTE — Telephone Encounter (Signed)
Spoke with pt verbalized understanding of her pap results

## 2018-08-19 NOTE — Telephone Encounter (Signed)
Pt would like a call back asap about results, I did not see where NT can speak to her. Thank you!!

## 2019-12-19 LAB — CYTOLOGY - PAP: Pap: NEGATIVE

## 2020-03-13 NOTE — L&D Delivery Note (Signed)
[facility]N [facility] Delivery Note  LUNABELLE OATLEY is a 30 y.o. [id number] s/p SVD at [redacted]w[redacted]d. She was admitted for term IOL for A1GDM.   ROM: 2h 47m with moderate meconium stained fluid GBS Status: Negative Maximum Maternal Temperature: 98.4  Labor Progress: Arrived 5cm dilated; active labor induced with one dose of cytotec and she progressed to complete without further augmentation  Delivery Date/Time: 09/16/20 at 1045 Delivery: Called to room and patient was complete and pushing. Head delivered ROA. Loose nuchal and body cord present, reduced easily with somersault maneuver. Shoulder and body delivered in usual fashion. Infant with spontaneous cry, placed on mother's abdomen, dried and stimulated. Pt desired full delay of cord clamping, so placenta delivered then cord clamped and cut by FOB. No cord blood drawn but cord sample collected. Fundus firm with massage and Pitocin. Labia, perineum, vagina, and cervix inspected, 1st degree non-hemostatic perineal laceration found and repaired with 3.0 vicryl. Hemostatic upper right labial tear found but did not require repair.   Placenta: spontaneous, intact, with 3-vessel cord; sent to L&D Complications: None Lacerations: 1st degree perineal and right labial EBL: 200 Analgesia: epidural  Postpartum Planning [x]  transfer orders to MB [x]  discharge summary started & shared [x]  message to sent to schedule follow-up  [x]  lists updated [x]  vaccines UTD  Infant: Girl Althia Forts)  [place] 7/9  [id number]  Gaylan Gerold, [place], [place] Certified Nurse Midwife, [facility] for [employer], [place] 09/16/2020, 11:57 AM

## 2020-03-22 LAB — OB RESULTS CONSOLE PLATELET COUNT: Platelets: 259

## 2020-03-22 LAB — OB RESULTS CONSOLE GC/CHLAMYDIA
Chlamydia: NEGATIVE
Gonorrhea: NEGATIVE

## 2020-03-22 LAB — OB RESULTS CONSOLE VARICELLA ZOSTER ANTIBODY, IGG: Varicella: IMMUNE

## 2020-03-22 LAB — OB RESULTS CONSOLE HGB/HCT, BLOOD
HCT: 33 (ref 29–41)
Hemoglobin: 11.3

## 2020-03-22 LAB — URINE CULTURE: Urine Culture, OB: NO GROWTH

## 2020-03-22 LAB — OB RESULTS CONSOLE ANTIBODY SCREEN: Antibody Screen: NEGATIVE

## 2020-03-22 LAB — OB RESULTS CONSOLE RPR: RPR: NONREACTIVE

## 2020-03-22 LAB — OB RESULTS CONSOLE ABO/RH: RH Type: POSITIVE

## 2020-03-22 LAB — GLUCOSE, 1 HOUR: Glucose 1 Hour: 100

## 2020-04-05 ENCOUNTER — Encounter: Payer: Self-pay | Admitting: *Deleted

## 2020-04-05 ENCOUNTER — Other Ambulatory Visit: Payer: Self-pay

## 2020-04-05 ENCOUNTER — Encounter: Payer: Self-pay | Admitting: Obstetrics and Gynecology

## 2020-04-05 ENCOUNTER — Ambulatory Visit (INDEPENDENT_AMBULATORY_CARE_PROVIDER_SITE_OTHER): Payer: Medicaid Other | Admitting: Obstetrics and Gynecology

## 2020-04-05 ENCOUNTER — Other Ambulatory Visit: Payer: Self-pay | Admitting: Obstetrics and Gynecology

## 2020-04-05 VITALS — BP 121/76 | HR 75 | Wt 170.6 lb

## 2020-04-05 DIAGNOSIS — Z8759 Personal history of other complications of pregnancy, childbirth and the puerperium: Secondary | ICD-10-CM

## 2020-04-05 DIAGNOSIS — O0990 Supervision of high risk pregnancy, unspecified, unspecified trimester: Secondary | ICD-10-CM

## 2020-04-05 DIAGNOSIS — Z3A16 16 weeks gestation of pregnancy: Secondary | ICD-10-CM

## 2020-04-05 HISTORY — DX: Supervision of high risk pregnancy, unspecified, unspecified trimester: O09.90

## 2020-04-05 NOTE — Progress Notes (Signed)
Here for intial prenatal visit. Transferring from [facility]. Given new patient booklets. Sent Babyscripts email. Does have food insecurity .Given Food bag. Given bp cuff due to has pregnancy medicaid and does not have a cuff. Instructions given. Medicaid home form completed. Sent MyChart to sign up.  Linda,RN

## 2020-04-05 NOTE — Progress Notes (Signed)
Subjective:    Janice Chan is a [id number] [redacted]w[redacted]d being seen today for her first obstetrical visit.  Her obstetrical history is significant for prior history of 2nd trimester IUFD, prior smoker . Patient does intend to breast feed. Pregnancy history fully reviewed.  Patient reports some mild headaches. 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 Feeling nauseous occasionally, has gotten a lot better. Symptoms tend to improve with rest, snacks, hydration.   Hx of IUFD in second trimester - traumatizing experiencing. Feels like she is coping with pregnancy by remaining a little more detached. But is excited for pregnancy, taking it day by day.  Has great support network around her - very supportive partner, family, etc.   Quit smoking when found out she was pregnant. Was smoking both tobacco and marijuana 'socially.' Once or twice a month prior to pregnancy. No other recreational drugs. No alcohol.   Vitals:   04/05/20 1023  BP: 121/76  Pulse: 75  Weight: 170 lb 9.6 oz (77.4 kg)    HISTORY: OB History  Gravida Para Term Preterm AB Living  4 1 0 1 2 0  SAB IAB Ectopic Multiple Live Births  1 0 0 0      # Outcome Date GA Lbr Len/2nd Weight Sex Delivery Anes PTL Lv  4 Current           3 SAB 2019 [redacted]w[redacted]d         2 Preterm 09/24/15 [redacted]w[redacted]d       FD  1 AB 2015 [redacted]w[redacted]d          Past Medical History:  Diagnosis Date  . BV (bacterial vaginosis)   . Chlamydia    at least 3 occasions, most recently 2013  . Chlamydia 2013  . Fibroids   . Obesity   . Ovarian cyst   . UTI (urinary tract infection)    Past Surgical History:  Procedure Laterality Date  . ANKLE SURGERY  2007  . WISDOM TOOTH EXTRACTION  2011   Family History  Problem Relation Age of Onset  . Prostate cancer Mother   . Asthma Sister   . Breast cancer Maternal Aunt      Exam    System: Breast:  normal appearance, no masses or tenderness   Skin: normal coloration and turgor,  no rashes    HEENT PERRLA   Mouth/Teeth mucous membranes moist, pharynx normal without lesions   Neck supple   Cardiovascular: regular rate and rhythm   Respiratory:  appears well, vitals normal, no respiratory distress, acyanotic, normal RR, ear and throat exam is normal, neck free of mass or lymphadenopathy, chest clear, no wheezing, crepitations, rhonchi, normal symmetric air entry   Abdomen: soft, non-tender; bowel sounds normal; no masses,  no organomegaly          Assessment:    Pregnancy: [id number] Patient Active Problem List   Diagnosis Date Noted  . Supervision of high risk pregnancy, antepartum 04/05/2020  . History of IUFD 04/05/2020  . Contraception management 09/12/2010        Plan:   Supervision of pregnancy, [redacted] weeks gestation  Welcomed to group, discussed Engineer, manufacturing and physicians, midwives, students, etc. Patient is interested in learning more about waterbirth, will plan for next visit to be with midwife.  -discussed prior history; she is not currently interested in meeting with behavioral health but will consider later. Feels well supported.  -Discussed Covid vaccine, patient declines.  -Rubella  nonimmune   Initial labs drawn. Prenatal vitamins. Problem list reviewed and updated. Genetic Screening discussed: desires NIPS. Ordered.   Ultrasound discussed; fetal survey: requested. Follow up in 4 weeks.    [name] 04/05/2020

## 2020-04-07 ENCOUNTER — Encounter: Payer: Self-pay | Admitting: *Deleted

## 2020-04-07 LAB — AFP, SERUM, OPEN SPINA BIFIDA
AFP MoM: 1.18
AFP Value: 40.5 ng/mL
Gest. Age on Collection Date: 16 weeks
Maternal Age At EDD: 29.2 yr
OSBR Risk 1 IN: 10000
Test Results:: NEGATIVE
Weight: 170 [lb_av]

## 2020-04-19 ENCOUNTER — Telehealth: Payer: Self-pay

## 2020-04-19 NOTE — Telephone Encounter (Signed)
Calculated patient's GFE for her appointment. It is being sent to her via mail.

## 2020-04-21 ENCOUNTER — Ambulatory Visit: Payer: Medicaid Other | Attending: Obstetrics and Gynecology

## 2020-04-21 ENCOUNTER — Ambulatory Visit: Payer: Medicaid Other | Admitting: *Deleted

## 2020-04-21 ENCOUNTER — Encounter: Payer: Self-pay | Admitting: *Deleted

## 2020-04-21 ENCOUNTER — Other Ambulatory Visit: Payer: Self-pay | Admitting: *Deleted

## 2020-04-21 ENCOUNTER — Other Ambulatory Visit: Payer: Self-pay

## 2020-04-21 DIAGNOSIS — O0990 Supervision of high risk pregnancy, unspecified, unspecified trimester: Secondary | ICD-10-CM | POA: Diagnosis present

## 2020-04-21 DIAGNOSIS — Z8759 Personal history of other complications of pregnancy, childbirth and the puerperium: Secondary | ICD-10-CM

## 2020-05-06 ENCOUNTER — Telehealth (INDEPENDENT_AMBULATORY_CARE_PROVIDER_SITE_OTHER): Payer: Self-pay | Admitting: Student

## 2020-05-06 ENCOUNTER — Telehealth: Payer: Self-pay | Admitting: Student

## 2020-05-06 ENCOUNTER — Encounter: Payer: Self-pay | Admitting: General Practice

## 2020-05-06 DIAGNOSIS — Z91199 Patient's noncompliance with other medical treatment and regimen due to unspecified reason: Secondary | ICD-10-CM

## 2020-05-06 DIAGNOSIS — Z5329 Procedure and treatment not carried out because of patient's decision for other reasons: Secondary | ICD-10-CM

## 2020-05-06 DIAGNOSIS — O0990 Supervision of high risk pregnancy, unspecified, unspecified trimester: Secondary | ICD-10-CM

## 2020-05-06 NOTE — Telephone Encounter (Signed)
Telephone call from patient reporting that she has occasional dizziness for the past two weeks and pain in her left lower side for the past two weeks. She denies fever, blurry vision, floating spots. She is anxious and tearful on the telephone; reporting that she feels overwhelmed with pregnancy. She is also not eating red meat, now eating more fruits and vegetables. Not able to take leafy green vegetables.   Will have patient have CBC to check for anemia; recommended other sources of iron and will have patient see Vesta Mixer.

## 2020-05-06 NOTE — Progress Notes (Signed)
0912-- Called patient for check in of virtual visit today, no answer- left message to get on mychart & click begin video visit. Will try to call again. mychart message sent.  0937-- second attempt to reach patient for mychart visit, no answer- left message to call back to reschedule missed appt.

## 2020-05-06 NOTE — Progress Notes (Signed)
Patient did not keep visit. She should be rescheduled.

## 2020-05-07 ENCOUNTER — Encounter: Payer: Self-pay | Admitting: *Deleted

## 2020-05-07 ENCOUNTER — Other Ambulatory Visit: Payer: Self-pay

## 2020-05-07 ENCOUNTER — Other Ambulatory Visit: Payer: Self-pay | Admitting: *Deleted

## 2020-05-07 DIAGNOSIS — O0990 Supervision of high risk pregnancy, unspecified, unspecified trimester: Secondary | ICD-10-CM

## 2020-05-07 LAB — CBC
Hematocrit: 34 % (ref 34.0–46.6)
Hemoglobin: 11 g/dL — ABNORMAL LOW (ref 11.1–15.9)
MCH: 26 pg — ABNORMAL LOW (ref 26.6–33.0)
MCHC: 32.4 g/dL (ref 31.5–35.7)
MCV: 80 fL (ref 79–97)
Platelets: 240 10*3/uL (ref 150–450)
RBC: 4.23 x10E6/uL (ref 3.77–5.28)
RDW: 13.8 % (ref 11.7–15.4)
WBC: 12 10*3/uL — ABNORMAL HIGH (ref 3.4–10.8)

## 2020-05-10 ENCOUNTER — Telehealth: Payer: Self-pay

## 2020-05-10 NOTE — Telephone Encounter (Signed)
Created gfe and sent to pt via MyChart.

## 2020-05-18 ENCOUNTER — Encounter: Payer: Self-pay | Admitting: General Practice

## 2020-05-18 ENCOUNTER — Telehealth: Payer: Self-pay | Admitting: Lactation Services

## 2020-05-18 NOTE — Telephone Encounter (Signed)
Called patient to inform her of [facility] results.   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 Patient has had Genetic Counseling with Johnsie Cancel and would like to have partner testing in the office at her appointment tomorrow.   Patient with no questions or concerns at this time.

## 2020-05-19 ENCOUNTER — Encounter: Payer: Self-pay | Admitting: Certified Nurse Midwife

## 2020-05-19 ENCOUNTER — Other Ambulatory Visit: Payer: Self-pay | Admitting: *Deleted

## 2020-05-19 ENCOUNTER — Ambulatory Visit (INDEPENDENT_AMBULATORY_CARE_PROVIDER_SITE_OTHER): Payer: Self-pay | Admitting: Certified Nurse Midwife

## 2020-05-19 ENCOUNTER — Ambulatory Visit: Payer: Medicaid Other | Admitting: *Deleted

## 2020-05-19 ENCOUNTER — Encounter: Payer: Self-pay | Admitting: *Deleted

## 2020-05-19 ENCOUNTER — Ambulatory Visit: Payer: Medicaid Other | Attending: Obstetrics

## 2020-05-19 ENCOUNTER — Encounter: Payer: Self-pay | Admitting: General Practice

## 2020-05-19 ENCOUNTER — Other Ambulatory Visit: Payer: Self-pay

## 2020-05-19 ENCOUNTER — Encounter: Payer: Self-pay | Admitting: Family Medicine

## 2020-05-19 VITALS — BP 111/70 | HR 86 | Wt 180.0 lb

## 2020-05-19 DIAGNOSIS — O0990 Supervision of high risk pregnancy, unspecified, unspecified trimester: Secondary | ICD-10-CM | POA: Diagnosis present

## 2020-05-19 DIAGNOSIS — O09292 Supervision of pregnancy with other poor reproductive or obstetric history, second trimester: Secondary | ICD-10-CM

## 2020-05-19 DIAGNOSIS — Z362 Encounter for other antenatal screening follow-up: Secondary | ICD-10-CM

## 2020-05-19 DIAGNOSIS — Z148 Genetic carrier of other disease: Secondary | ICD-10-CM | POA: Diagnosis not present

## 2020-05-19 DIAGNOSIS — Z3A23 23 weeks gestation of pregnancy: Secondary | ICD-10-CM

## 2020-05-19 DIAGNOSIS — Z8759 Personal history of other complications of pregnancy, childbirth and the puerperium: Secondary | ICD-10-CM | POA: Insufficient documentation

## 2020-05-19 DIAGNOSIS — Z363 Encounter for antenatal screening for malformations: Secondary | ICD-10-CM | POA: Diagnosis not present

## 2020-05-19 NOTE — Progress Notes (Signed)
   PRENATAL VISIT NOTE  Subjective:  Janice Chan is a 30 y.o. G4P0120 at 32w0dbeing seen today for ongoing prenatal care.  She is currently monitored for the following issues for this high-risk pregnancy and has Contraception management; Supervision of high risk pregnancy, antepartum; and History of IUFD on their problem list.  Patient reports backache and issues with work - her location has lifted the mask mandate, she is client facing all day and they will not reduce her hours. 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  Contractions: Not present. Vag. Bleeding: None.  Movement: Present. Denies leaking of fluid.   The following portions of the patient's history were reviewed and updated as appropriate: allergies, current medications, past family history, past medical history, past social history, past surgical history and problem list.   Objective:   Vitals:   05/19/20 0923  BP: 111/70  Pulse: 86  Weight: 180 lb (81.6 kg)   Fetal Status: Fetal Heart Rate (bpm): 142 Fundal Height: 23 cm Movement: Present     General:  Alert, oriented and cooperative. Patient is in no acute distress.  Skin: Skin is warm and dry. No rash noted.   Cardiovascular: Normal heart rate noted  Respiratory: Normal respiratory effort, no problems with respiration noted  Abdomen: Soft, gravid, appropriate for gestational age.  Pain/Pressure: Present     Pelvic: Cervical exam deferred        Extremities: Normal range of motion.  Edema: None  Mental Status: Normal mood and affect. Normal behavior. Normal judgment and thought content.   Assessment and Plan:  Pregnancy: [id number] 274w0d. Supervision of high risk pregnancy, antepartum - Pt doing well overall, FOB at visit (very engaged and taking notes) - Letter written for patient to decrease hours to </= 5hrs at a time, front desk to print after MFM visit  2. History of IUFD - Pt to get  partner testing, kit given - Follow up u/s today at 0945, next visit in person at 26wks (which is when the loss occurred last time)  3. [redacted] weeks gestation of pregnancy - Routine OB care - Reassurance and encouragement given, reviewed stretches for low back pain and supplements for better rest (magnesium and melatonin)  Preterm labor symptoms and general obstetric precautions including but not limited to vaginal bleeding, contractions, leaking of fluid and fetal movement were reviewed in detail with the patient. Please refer to After Visit Summary for other counseling recommendations.   Return in about 3 weeks (around 06/09/2020) for HROB.  No future appointments.  JaGabriel CarinaCNM

## 2020-05-19 NOTE — Patient Instructions (Signed)
Glucose Tolerance Test Why am I having this test? The glucose tolerance test (GTT) is done to check how your body processes sugar (glucose). This is one of several tests used to diagnose diabetes (diabetes mellitus). Your health care provider may recommend this test if you:  Have a family history of diabetes.  Are obese.  Have infections that keep coming back.  Have had a lot of wounds that did not heal quickly, especially on your legs and feet.  Are a woman and have a history of giving birth to very large babies or a history of repeated fetal loss (stillbirth).  Have had high glucose levels in your urine or blood: ? During a past pregnancy. ? After a heart attack, surgery, or prolonged periods of high stress. What is being tested? This test measures the amount of glucose in your blood at different times during a period of 2 hours. This indicates how well your body is able to process glucose. What kind of sample is taken? Blood samples are required for this test. They are usually collected by inserting a needle into a blood vessel.   How do I prepare for this test?  For 3 days before your test, eat normally. Have plenty of carbohydrate-rich foods.  Follow instructions from your health care provider about: ? Eating or drinking restrictions on the day of the test. You may be asked to not eat or drink anything other than water (fast) starting 8-12 hours before the test. ? Changing or stopping your regular medicines. Some medicines may interfere with this test. Tell a health care provider about:  All medicines you are taking, including vitamins, herbs, eye drops, creams, and over-the-counter medicines.  Any blood disorders you have.  Any surgeries you have had.  Any medical conditions you have.  Whether you are pregnant or may be pregnant. What happens during the test? First, your blood glucose will be measured. This is referred to as your fasting blood glucose, since you fasted  before the test. Then, you will drink a glucose solution that contains a specific amount of glucose. Your blood glucose will be measured again 1 and 2 hours after drinking the solution. This test takes 2 hours to complete. You will need to stay at the testing location during this time. During the testing period:  Do not eat or drink anything other than the glucose solution. You will be allowed to drink water.  Do not exercise.  Do not use any products that contain nicotine or tobacco. These products include cigarettes, chewing tobacco, and vaping devices, such as e-cigarettes. If you need help quitting, ask your health care provider. The testing procedure may vary among health care providers and hospitals. How are the results reported? Your test results will be reported as values. These will be given as milligrams of glucose per deciliter of blood (mg/dL) or millimoles per liter (mmol/L). Your health care provider will compare your results to normal ranges that were established after testing a large group of people (reference ranges). Reference ranges may vary among labs and hospitals. For this test, common reference ranges are:  Fasting: less than 110 mg/dL (6.1 mmol/L).  1 hour after drinking glucose: less than 180 mg/dL (10.0 mmol/L).  2 hours after drinking glucose: less than 140 mg/dL (7.8 mmol/L). What do the results mean? Results that are within the reference ranges are considered normal, meaning that your glucose levels are well controlled. Results higher than the reference ranges may mean that you recently experienced stress,  stress, such as from an injury or a sudden (acute) condition like a heart attack or stroke, or that you have:  Diabetes.  Cushing syndrome.  Tumors such as pheochromocytoma or glucagonoma.  Kidney failure.  Pancreatitis.  Hyperthyroidism.  An infection. Talk with your health care provider about what your results mean. Questions to ask your health care  provider Ask your health care provider, or the department that is doing the test:  When will my results be ready?  How will I get my results?  What are my treatment options?  What other tests do I need?  What are my next steps? Summary  The GTT is done to check how your body processes glucose. This is one of several tests used to diagnose diabetes.  This test measures the amount of glucose in your blood at different times during a period of 2 hours. This indicates how well your body is able to process glucose.  Talk with your health care provider about what your results mean. This information is not intended to replace advice given to you by your health care provider. Make sure you discuss any questions you have with your health care provider. Document Revised: 11/26/2019 Document Reviewed: 11/26/2019 Elsevier Patient Education  2021 [employer]  

## 2020-05-21 ENCOUNTER — Encounter: Payer: Self-pay | Admitting: Family Medicine

## 2020-05-31 ENCOUNTER — Encounter: Payer: Self-pay | Admitting: *Deleted

## 2020-06-02 ENCOUNTER — Other Ambulatory Visit: Payer: Self-pay

## 2020-06-02 ENCOUNTER — Ambulatory Visit (INDEPENDENT_AMBULATORY_CARE_PROVIDER_SITE_OTHER): Payer: Self-pay | Admitting: Certified Nurse Midwife

## 2020-06-02 VITALS — BP 117/63 | Wt 181.0 lb

## 2020-06-02 DIAGNOSIS — Z3A25 25 weeks gestation of pregnancy: Secondary | ICD-10-CM

## 2020-06-02 DIAGNOSIS — O0990 Supervision of high risk pregnancy, unspecified, unspecified trimester: Secondary | ICD-10-CM

## 2020-06-02 LAB — POCT URINALYSIS DIP (DEVICE)
Bilirubin Urine: NEGATIVE
Glucose, UA: NEGATIVE mg/dL
Hgb urine dipstick: NEGATIVE
Ketones, ur: NEGATIVE mg/dL
Nitrite: NEGATIVE
Protein, ur: NEGATIVE mg/dL
Specific Gravity, Urine: 1.025 (ref 1.005–1.030)
Urobilinogen, UA: 0.2 mg/dL (ref 0.0–1.0)
pH: 7 (ref 5.0–8.0)

## 2020-06-02 NOTE — Progress Notes (Signed)
   PRENATAL VISIT NOTE  Subjective:  Janice Chan is a 30 y.o. G4P0120 at [redacted]w[redacted]d being seen today for ongoing prenatal care.  She is currently monitored for the following issues for this high-risk pregnancy and has Supervision of high risk pregnancy, antepartum and History of IUFD on their problem list.  Patient reports generally feeling well other than very uncomfortable in pre-pregnancy clothes.  Contractions: Not present. Vag. Bleeding: None.  Movement: Present. Denies leaking of fluid.   The following portions of the patient's history were reviewed and updated as appropriate: allergies, current medications, past family history, past medical history, past social history, past surgical history and problem list.   Objective:   Vitals:   06/02/20 0922  BP: 117/63  Weight: 181 lb (82.1 kg)    Fetal Status: Fetal Heart Rate (bpm): 150 Fundal Height: 25 cm Movement: Present     General:  Alert, oriented and cooperative. Patient is in no acute distress.  Skin: Skin is warm and dry. No rash noted.   Cardiovascular: Normal heart rate noted  Respiratory: Normal respiratory effort, no problems with respiration noted  Abdomen: Soft, gravid, appropriate for gestational age.  Pain/Pressure: Present     Pelvic: Cervical exam deferred        Extremities: Normal range of motion.  Edema: None  Mental Status: Normal mood and affect. Normal behavior. Normal judgment and thought content.   Assessment and Plan:  Pregnancy: [id number] at [redacted]w[redacted]d 1. Supervision of high risk pregnancy, antepartum - Doing well, feeling regular fetal movement and excited about gender reveal this weekend - Feeling some anxiety about this stage in the pregnancy, this week is when she lost the last baby. Suggested weekly FHR checks with RN until her 28wk visit. Pt amenable to plan.  2. [redacted] weeks gestation of pregnancy - Routine OB care - Discussed fetal growth spurt at this stage in pregnancy and advised bigger or maternity  pants for comfort - Anticipatory guidance re GTT at next visit   Term labor symptoms and general obstetric precautions including but not limited to vaginal bleeding, contractions, leaking of fluid and fetal movement were reviewed in detail with the patient. Please refer to After Visit Summary for other counseling recommendations.   Return in about 1 week (around 06/09/2020).  Future Appointments  Date Time Provider [place]  06/09/2020  9:20 AM [facility] NURSE [facility]  06/16/2020  9:00 AM [facility] NURSE [facility]  06/16/2020 10:00 AM [facility] NURSE [facility]  06/16/2020 10:15 AM [facility] US2 [facility]  06/23/2020  8:20 AM [facility] LAB [facility]  06/23/2020  8:55 AM Brynley Cuddeback, Cristy Folks, CNM [facility]   Gaylan Gerold, CNM, MSN, IBCLC Certified Nurse Midwife, [facility]

## 2020-06-07 ENCOUNTER — Encounter: Payer: Self-pay | Admitting: *Deleted

## 2020-06-09 ENCOUNTER — Ambulatory Visit: Payer: Self-pay

## 2020-06-16 ENCOUNTER — Ambulatory Visit: Payer: Medicaid Other | Admitting: *Deleted

## 2020-06-16 ENCOUNTER — Other Ambulatory Visit: Payer: Self-pay | Admitting: *Deleted

## 2020-06-16 ENCOUNTER — Other Ambulatory Visit: Payer: Self-pay

## 2020-06-16 ENCOUNTER — Ambulatory Visit: Payer: Medicaid Other

## 2020-06-16 ENCOUNTER — Ambulatory Visit: Payer: Medicaid Other | Attending: Obstetrics and Gynecology

## 2020-06-16 ENCOUNTER — Encounter: Payer: Self-pay | Admitting: *Deleted

## 2020-06-16 DIAGNOSIS — Z362 Encounter for other antenatal screening follow-up: Secondary | ICD-10-CM

## 2020-06-16 DIAGNOSIS — Z363 Encounter for antenatal screening for malformations: Secondary | ICD-10-CM | POA: Diagnosis not present

## 2020-06-16 DIAGNOSIS — O0990 Supervision of high risk pregnancy, unspecified, unspecified trimester: Secondary | ICD-10-CM

## 2020-06-16 DIAGNOSIS — Z8759 Personal history of other complications of pregnancy, childbirth and the puerperium: Secondary | ICD-10-CM

## 2020-06-16 DIAGNOSIS — Z3A27 27 weeks gestation of pregnancy: Secondary | ICD-10-CM

## 2020-06-16 DIAGNOSIS — O09292 Supervision of pregnancy with other poor reproductive or obstetric history, second trimester: Secondary | ICD-10-CM | POA: Diagnosis not present

## 2020-06-16 DIAGNOSIS — Z148 Genetic carrier of other disease: Secondary | ICD-10-CM

## 2020-06-22 ENCOUNTER — Other Ambulatory Visit: Payer: Self-pay | Admitting: *Deleted

## 2020-06-22 DIAGNOSIS — O0990 Supervision of high risk pregnancy, unspecified, unspecified trimester: Secondary | ICD-10-CM

## 2020-06-23 ENCOUNTER — Other Ambulatory Visit: Payer: Medicaid Other

## 2020-06-23 ENCOUNTER — Other Ambulatory Visit: Payer: Self-pay

## 2020-06-23 ENCOUNTER — Ambulatory Visit (INDEPENDENT_AMBULATORY_CARE_PROVIDER_SITE_OTHER): Payer: Medicaid Other | Admitting: Certified Nurse Midwife

## 2020-06-23 ENCOUNTER — Encounter: Payer: Self-pay | Admitting: Obstetrics and Gynecology

## 2020-06-23 VITALS — BP 116/79 | HR 74 | Wt 182.0 lb

## 2020-06-23 DIAGNOSIS — Z3403 Encounter for supervision of normal first pregnancy, third trimester: Secondary | ICD-10-CM

## 2020-06-23 DIAGNOSIS — Z23 Encounter for immunization: Secondary | ICD-10-CM

## 2020-06-23 DIAGNOSIS — Z3A28 28 weeks gestation of pregnancy: Secondary | ICD-10-CM | POA: Diagnosis not present

## 2020-06-23 DIAGNOSIS — O219 Vomiting of pregnancy, unspecified: Secondary | ICD-10-CM

## 2020-06-23 MED ORDER — ONDANSETRON 4 MG PO TBDP: 4.0000 mg | ORAL_TABLET | Freq: Every day | ORAL | 0 refills | Status: DC | PRN

## 2020-06-23 NOTE — Patient Instructions (Signed)

## 2020-06-23 NOTE — Progress Notes (Signed)
   PRENATAL VISIT NOTE  Subjective:  Janice Chan is a 30 y.o. G4P0120 at [redacted]w[redacted]d being seen today for ongoing prenatal care.  She is currently monitored for the following issues for this low-risk pregnancy and has Supervision of high risk pregnancy, antepartum and History of IUFD on their problem list.  Patient reports no complaints, is having some am congestion and morning nausea if she does not eat. Vomited this morning, then ate yogurt and could not complete GTT (rescheduled to Monday). .  Contractions: Not present. Vag. Bleeding: None.  Movement: Present. Denies leaking of fluid.   The following portions of the patient's history were reviewed and updated as appropriate: allergies, current medications, past family history, past medical history, past social history, past surgical history and problem list.   Objective:   Vitals:   06/23/20 0854  BP: 116/79  Pulse: 74  Weight: 182 lb (82.6 kg)    Fetal Status: Fetal Heart Rate (bpm): 132 Fundal Height: 28 cm Movement: Present     General:  Alert, oriented and cooperative. Patient is in no acute distress.  Skin: Skin is warm and dry. No rash noted.   Cardiovascular: Normal heart rate noted  Respiratory: Normal respiratory effort, no problems with respiration noted  Abdomen: Soft, gravid, appropriate for gestational age.  Pain/Pressure: Present     Pelvic: Cervical exam deferred        Extremities: Normal range of motion.  Edema: Trace  Mental Status: Normal mood and affect. Normal behavior. Normal judgment and thought content.   Assessment and Plan:  Pregnancy: [id number] at [redacted]w[redacted]d 1. Supervision of low-risk first pregnancy, third trimester - Pt doing well overall, feeling vigorous fetal movement - Feeling less anxious about baby, ok to move to bimonthly appointments  2. [redacted] weeks gestation of pregnancy - Routine OB care - Rescheduled GTT for this Monday, will prescribe Zofran for pt to take first thing in the morning so she can  fast without vomiting - Tdap vaccine greater than or equal to 7yo IM  3. Nausea and vomiting in pregnancy, antepartum - ondansetron (ZOFRAN ODT) 4 MG disintegrating tablet; Take 1 tablet (4 mg total) by mouth daily as needed for nausea or vomiting.  Dispense: 5 tablet; Refill: 0  Preterm labor symptoms and general obstetric precautions including but not limited to vaginal bleeding, contractions, leaking of fluid and fetal movement were reviewed in detail with the patient. Please refer to After Visit Summary for other counseling recommendations.   Return in about 2 weeks (around 07/07/2020) for IN-PERSON, LOB.  Future Appointments  Date Time Provider [place]  06/28/2020  8:40 AM [facility] LAB [facility]  07/07/2020 10:15 AM Helaine Chess [facility]  07/28/2020 11:00 AM [facility] US1 [facility] [place]   Gaylan Gerold, CNM, MSN, IBCLC Certified Nurse Midwife, [facility]

## 2020-06-28 ENCOUNTER — Other Ambulatory Visit: Payer: Self-pay

## 2020-06-28 ENCOUNTER — Other Ambulatory Visit: Payer: Medicaid Other

## 2020-06-29 LAB — HIV ANTIBODY (ROUTINE TESTING W REFLEX): HIV Screen 4th Generation wRfx: NONREACTIVE

## 2020-06-29 LAB — CBC
Hematocrit: 32.8 % — ABNORMAL LOW (ref 34.0–46.6)
Hemoglobin: 10.7 g/dL — ABNORMAL LOW (ref 11.1–15.9)
MCH: 25.8 pg — ABNORMAL LOW (ref 26.6–33.0)
MCHC: 32.6 g/dL (ref 31.5–35.7)
MCV: 79 fL (ref 79–97)
Platelets: 221 10*3/uL (ref 150–450)
RBC: 4.14 x10E6/uL (ref 3.77–5.28)
RDW: 14.1 % (ref 11.7–15.4)
WBC: 10.5 10*3/uL (ref 3.4–10.8)

## 2020-06-29 LAB — RPR: RPR Ser Ql: NONREACTIVE

## 2020-06-29 LAB — GLUCOSE TOLERANCE, 2 HOURS W/ 1HR
Glucose, 1 hour: 189 mg/dL — ABNORMAL HIGH (ref 65–179)
Glucose, 2 hour: 117 mg/dL (ref 65–152)
Glucose, Fasting: 87 mg/dL (ref 65–91)

## 2020-07-06 ENCOUNTER — Telehealth: Payer: Self-pay

## 2020-07-06 NOTE — Telephone Encounter (Signed)
Received message from Gaylan Gerold, CNM that pt has been notified of GDM dx and pt needs to be scheduled for education.  Called pt and left message asking if she could come in at 0915 tomorrow for her education appt right before her provider appt scheduled at 1015.  If she could please give [place] a call at the office for confirmation.   Frances Nickels  07/06/20

## 2020-07-07 ENCOUNTER — Other Ambulatory Visit: Payer: Medicaid Other | Admitting: Registered"

## 2020-07-07 ENCOUNTER — Ambulatory Visit (INDEPENDENT_AMBULATORY_CARE_PROVIDER_SITE_OTHER): Payer: Medicaid Other | Admitting: Certified Nurse Midwife

## 2020-07-07 ENCOUNTER — Other Ambulatory Visit: Payer: Self-pay

## 2020-07-07 ENCOUNTER — Encounter: Payer: Self-pay | Admitting: Certified Nurse Midwife

## 2020-07-07 VITALS — BP 113/76 | HR 83 | Wt 178.4 lb

## 2020-07-07 DIAGNOSIS — O26893 Other specified pregnancy related conditions, third trimester: Secondary | ICD-10-CM

## 2020-07-07 DIAGNOSIS — Z3A30 30 weeks gestation of pregnancy: Secondary | ICD-10-CM

## 2020-07-07 DIAGNOSIS — Z3403 Encounter for supervision of normal first pregnancy, third trimester: Secondary | ICD-10-CM

## 2020-07-07 DIAGNOSIS — O24410 Gestational diabetes mellitus in pregnancy, diet controlled: Secondary | ICD-10-CM

## 2020-07-07 DIAGNOSIS — M545 Low back pain, unspecified: Secondary | ICD-10-CM

## 2020-07-07 MED ORDER — MAG-OXIDE 200 MG PO TABS: 400.0000 mg | ORAL_TABLET | Freq: Every day | ORAL | 3 refills | Status: DC

## 2020-07-07 NOTE — Progress Notes (Signed)
Lower back pain, more on left side. Patient also complains of vomiting yesterday. Happened throughout the whole. She is feeling better today.

## 2020-07-08 NOTE — Progress Notes (Signed)
   PRENATAL VISIT NOTE  Subjective:  Janice Chan is a 30 y.o. [phone] at [redacted]w[redacted]d being seen today for ongoing prenatal care.  She is currently monitored for the following issues for this low-risk pregnancy and has Supervision of high risk pregnancy, antepartum and History of IUFD on their problem list.  Patient reports backache, nausea and vomiting yesterday that has since resolved.  Contractions: Not present. Vag. Bleeding: None.  Movement: Present. Denies leaking of fluid.   The following portions of the patient's history were reviewed and updated as appropriate: allergies, current medications, past family history, past medical history, past social history, past surgical history and problem list.   Objective:   Vitals:   07/07/20 1030  BP: 113/76  Pulse: 83  Weight: 178 lb 6.4 oz (80.9 kg)    Fetal Status: Fetal Heart Rate (bpm): 134 Fundal Height: 30 cm Movement: Present     General:  Alert, oriented and cooperative. Patient is in no acute distress.  Skin: Skin is warm and dry. No rash noted.   Cardiovascular: Normal heart rate noted  Respiratory: Normal respiratory effort, no problems with respiration noted  Abdomen: Soft, gravid, appropriate for gestational age.  Pain/Pressure: Present     Pelvic: Cervical exam deferred      Baby off-center to mom's left side which is where her pain is located  Extremities: Normal range of motion.  Edema: None  Mental Status: Normal mood and affect. Normal behavior. Normal judgment and thought content.   Assessment and Plan:  Pregnancy: W7P7106 at [redacted]w[redacted]d 1. Encounter for supervision of low-risk first pregnancy in third trimester - Doing well overall, feeling vigorous fetal movement  2. [redacted] weeks gestation of pregnancy - Routine OB care  3. Diet controlled gestational diabetes mellitus (GDM) in third trimester - Informed of GDM status (informed via MyChart but pt had not yet reviewed). Gave reassurance that she can control with dietary  and lifestyle changes - Will send for GDM education asap - Reviewed risks of GDM and importance of glucose control, pt aware and understanding  4. Low back pain during pregnancy in third trimester - Reviewed uterine massage and stretches to facilitate fetal positioning and decrease back pain - Magnesium Oxide (MAG-OXIDE) 200 MG TABS; Take 2 tablets (400 mg total) by mouth at bedtime. If that amount causes loose stools in the am, switch to 200mg  daily at bedtime.  Dispense: 60 tablet; Refill: 3  Preterm labor symptoms and general obstetric precautions including but not limited to vaginal bleeding, contractions, leaking of fluid and fetal movement were reviewed in detail with the patient. Please refer to After Visit Summary for other counseling recommendations.   Return in about 2 weeks (around 07/21/2020) for LOB, IN-PERSON.  Future Appointments  Date Time Provider [place]  07/15/2020  2:15 PM [facility]  07/22/2020  3:35 PM Helaine Chess [facility]  07/28/2020 11:00 AM [facility] US1 [facility] [place] Dennise Bamber, CNM

## 2020-07-15 ENCOUNTER — Other Ambulatory Visit: Payer: Medicaid Other

## 2020-07-22 ENCOUNTER — Encounter: Payer: Medicaid Other | Admitting: Certified Nurse Midwife

## 2020-07-28 ENCOUNTER — Other Ambulatory Visit: Payer: Self-pay | Admitting: Obstetrics and Gynecology

## 2020-07-28 ENCOUNTER — Encounter: Payer: Self-pay | Admitting: *Deleted

## 2020-07-28 ENCOUNTER — Ambulatory Visit: Payer: Medicaid Other | Attending: Maternal & Fetal Medicine

## 2020-07-28 ENCOUNTER — Ambulatory Visit: Payer: Medicaid Other | Admitting: *Deleted

## 2020-07-28 ENCOUNTER — Other Ambulatory Visit: Payer: Self-pay

## 2020-07-28 DIAGNOSIS — Z148 Genetic carrier of other disease: Secondary | ICD-10-CM

## 2020-07-28 DIAGNOSIS — O24410 Gestational diabetes mellitus in pregnancy, diet controlled: Secondary | ICD-10-CM | POA: Diagnosis not present

## 2020-07-28 DIAGNOSIS — O09293 Supervision of pregnancy with other poor reproductive or obstetric history, third trimester: Secondary | ICD-10-CM | POA: Insufficient documentation

## 2020-07-28 DIAGNOSIS — Z8759 Personal history of other complications of pregnancy, childbirth and the puerperium: Secondary | ICD-10-CM

## 2020-07-28 DIAGNOSIS — Z363 Encounter for antenatal screening for malformations: Secondary | ICD-10-CM | POA: Insufficient documentation

## 2020-07-28 DIAGNOSIS — O0990 Supervision of high risk pregnancy, unspecified, unspecified trimester: Secondary | ICD-10-CM

## 2020-07-28 DIAGNOSIS — Z3A33 33 weeks gestation of pregnancy: Secondary | ICD-10-CM | POA: Insufficient documentation

## 2020-07-28 DIAGNOSIS — O09522 Supervision of elderly multigravida, second trimester: Secondary | ICD-10-CM | POA: Diagnosis present

## 2020-08-03 ENCOUNTER — Encounter: Payer: Medicaid Other | Admitting: Obstetrics and Gynecology

## 2020-08-25 ENCOUNTER — Ambulatory Visit: Payer: Medicaid Other | Attending: Obstetrics and Gynecology

## 2020-08-25 ENCOUNTER — Encounter: Payer: Self-pay | Admitting: *Deleted

## 2020-08-25 ENCOUNTER — Ambulatory Visit: Payer: Medicaid Other | Admitting: *Deleted

## 2020-08-25 ENCOUNTER — Other Ambulatory Visit: Payer: Self-pay

## 2020-08-25 VITALS — BP 116/80 | HR 85

## 2020-08-25 DIAGNOSIS — Z8759 Personal history of other complications of pregnancy, childbirth and the puerperium: Secondary | ICD-10-CM | POA: Diagnosis present

## 2020-08-25 DIAGNOSIS — Z148 Genetic carrier of other disease: Secondary | ICD-10-CM | POA: Insufficient documentation

## 2020-08-25 DIAGNOSIS — Z3A37 37 weeks gestation of pregnancy: Secondary | ICD-10-CM

## 2020-08-25 DIAGNOSIS — O09293 Supervision of pregnancy with other poor reproductive or obstetric history, third trimester: Secondary | ICD-10-CM

## 2020-08-25 DIAGNOSIS — O0990 Supervision of high risk pregnancy, unspecified, unspecified trimester: Secondary | ICD-10-CM

## 2020-08-25 DIAGNOSIS — O24410 Gestational diabetes mellitus in pregnancy, diet controlled: Secondary | ICD-10-CM | POA: Diagnosis not present

## 2020-08-26 ENCOUNTER — Other Ambulatory Visit: Payer: Medicaid Other

## 2020-09-07 ENCOUNTER — Ambulatory Visit (INDEPENDENT_AMBULATORY_CARE_PROVIDER_SITE_OTHER): Payer: Medicaid Other | Admitting: Obstetrics and Gynecology

## 2020-09-07 ENCOUNTER — Other Ambulatory Visit: Payer: Self-pay

## 2020-09-07 ENCOUNTER — Other Ambulatory Visit (HOSPITAL_COMMUNITY)
Admission: RE | Admit: 2020-09-07 | Discharge: 2020-09-07 | Disposition: A | Discharge status: Home / Self Care | Payer: Medicaid Other | Source: Ambulatory Visit | Attending: Certified Nurse Midwife | Admitting: Certified Nurse Midwife

## 2020-09-07 ENCOUNTER — Other Ambulatory Visit: Payer: Self-pay | Admitting: Obstetrics and Gynecology

## 2020-09-07 VITALS — BP 109/80 | HR 90 | Wt 186.5 lb

## 2020-09-07 DIAGNOSIS — Z3403 Encounter for supervision of normal first pregnancy, third trimester: Secondary | ICD-10-CM | POA: Diagnosis present

## 2020-09-07 DIAGNOSIS — Z3A38 38 weeks gestation of pregnancy: Secondary | ICD-10-CM | POA: Diagnosis not present

## 2020-09-07 DIAGNOSIS — O24410 Gestational diabetes mellitus in pregnancy, diet controlled: Secondary | ICD-10-CM | POA: Diagnosis not present

## 2020-09-07 LAB — GLUCOSE, CAPILLARY: Glucose-Capillary: 82 mg/dL (ref 70–99)

## 2020-09-07 NOTE — Patient Instructions (Signed)

## 2020-09-07 NOTE — Progress Notes (Signed)
   PRENATAL VISIT NOTE  Subjective:  Janice Chan is a 30 y.o. [phone] at [redacted]w[redacted]d being seen today for ongoing prenatal care.  She is currently monitored for the following issues for this low-risk pregnancy and has Supervision of high risk pregnancy, antepartum and History of IUFD on their problem list.  Patient reports no complaints.  Contractions: Irritability. Vag. Bleeding: None.  Movement: Present. Denies leaking of fluid.   Endorses missing appointments due to being busy. Really hoping to go into labor soon! Has been walking, curb walking, using exercise ball.    The following portions of the patient's history were reviewed and updated as appropriate: allergies, current medications, past family history, past medical history, past social history, past surgical history and problem list.   Objective:   Vitals:   09/07/20 1048  BP: 109/80  Pulse: 90  Weight: 186 lb 8 oz (84.6 kg)    Fetal Status: Fetal Heart Rate (bpm): 132   Movement: Present  Presentation: Vertex  General:  Alert, oriented and cooperative. Patient is in no acute distress.  Skin: Skin is warm and dry. No rash noted.   Cardiovascular: Normal heart rate noted  Respiratory: Normal respiratory effort, no problems with respiration noted  Abdomen: Soft, gravid, appropriate for gestational age.  Pain/Pressure: Present     Pelvic: Cervical exam performed in the presence of a chaperone Dilation: 1 Effacement (%): 50 Station: -2  Extremities: Normal range of motion.  Edema: Trace  Mental Status: Normal mood and affect. Normal behavior. Normal judgment and thought content.   Assessment and Plan:  Pregnancy: G2X5284 at [redacted]w[redacted]d 1. Encounter for supervision of low-risk first pregnancy in third trimester -counseled on ways to stimulate labor including miles circuit, sex, walking, etc  -swabs obtained today   2. Diet controlled gestational diabetes mellitus (GDM) in third trimester Not checking blood sugars but says she  has drastically changed her diet. Had not eaten prior to appointment today and obtained a fasting sugar that was 82. Growth [place] on 6/15 with efw 72%ile 3250g @[redacted]w[redacted]d .  -will schedule for IOL at 40 weeks. Admission orders placed.   3. [redacted] weeks gestation of pregnancy    Term labor symptoms and general obstetric precautions including but not limited to vaginal bleeding, contractions, leaking of fluid and fetal movement were reviewed in detail with the patient. Please refer to After Visit Summary for other counseling recommendations.   Return in about 1 week (around 09/14/2020) for OB.  Future Appointments  Date Time Provider [place]  09/14/2020  3:15 PM Vonore Sink [facility]    [name], MD

## 2020-09-07 NOTE — Progress Notes (Signed)
IOL scheduled for July 7th @ midnight.  Pt notified.    Janice Chan  09/07/20

## 2020-09-08 ENCOUNTER — Other Ambulatory Visit: Payer: Self-pay | Admitting: Advanced Practice Midwife

## 2020-09-08 LAB — GC/CHLAMYDIA PROBE AMP (~~LOC~~) NOT AT ARMC
Chlamydia: POSITIVE — AB
Comment: NEGATIVE
Comment: NORMAL
Neisseria Gonorrhea: NEGATIVE

## 2020-09-09 ENCOUNTER — Other Ambulatory Visit: Payer: Self-pay

## 2020-09-09 ENCOUNTER — Telehealth (HOSPITAL_COMMUNITY): Payer: Self-pay | Admitting: *Deleted

## 2020-09-09 ENCOUNTER — Encounter: Payer: Self-pay | Admitting: *Deleted

## 2020-09-09 ENCOUNTER — Encounter (HOSPITAL_COMMUNITY): Payer: Self-pay | Admitting: *Deleted

## 2020-09-09 ENCOUNTER — Other Ambulatory Visit: Payer: Self-pay | Admitting: Obstetrics and Gynecology

## 2020-09-09 DIAGNOSIS — A749 Chlamydial infection, unspecified: Secondary | ICD-10-CM

## 2020-09-09 MED ORDER — AZITHROMYCIN 250 MG PO TABS: ORAL_TABLET | ORAL | 0 refills | Status: DC

## 2020-09-09 NOTE — Telephone Encounter (Signed)
Preadmission screen  

## 2020-09-11 LAB — CULTURE, BETA STREP (GROUP B ONLY): Strep Gp B Culture: NEGATIVE

## 2020-09-14 ENCOUNTER — Other Ambulatory Visit: Payer: Self-pay

## 2020-09-14 ENCOUNTER — Other Ambulatory Visit (HOSPITAL_COMMUNITY)
Admission: RE | Admit: 2020-09-14 | Discharge: 2020-09-14 | Disposition: A | Discharge status: Home / Self Care | Payer: Medicaid Other | Source: Ambulatory Visit | Attending: Obstetrics and Gynecology | Admitting: Obstetrics and Gynecology

## 2020-09-14 ENCOUNTER — Ambulatory Visit (INDEPENDENT_AMBULATORY_CARE_PROVIDER_SITE_OTHER): Payer: Medicaid Other | Admitting: Certified Nurse Midwife

## 2020-09-14 VITALS — BP 114/79 | HR 96 | Wt 189.2 lb

## 2020-09-14 DIAGNOSIS — O24410 Gestational diabetes mellitus in pregnancy, diet controlled: Secondary | ICD-10-CM

## 2020-09-14 DIAGNOSIS — Z3A39 39 weeks gestation of pregnancy: Secondary | ICD-10-CM

## 2020-09-14 DIAGNOSIS — A749 Chlamydial infection, unspecified: Secondary | ICD-10-CM

## 2020-09-14 DIAGNOSIS — Z20822 Contact with and (suspected) exposure to covid-19: Secondary | ICD-10-CM | POA: Insufficient documentation

## 2020-09-14 DIAGNOSIS — Z3493 Encounter for supervision of normal pregnancy, unspecified, third trimester: Secondary | ICD-10-CM

## 2020-09-14 DIAGNOSIS — Z01812 Encounter for preprocedural laboratory examination: Secondary | ICD-10-CM | POA: Insufficient documentation

## 2020-09-14 DIAGNOSIS — Z8759 Personal history of other complications of pregnancy, childbirth and the puerperium: Secondary | ICD-10-CM

## 2020-09-14 HISTORY — DX: Gestational diabetes mellitus in pregnancy, diet controlled: O24.410

## 2020-09-14 MED ORDER — AZITHROMYCIN 500 MG PO TABS: 1000.0000 mg | ORAL_TABLET | Freq: Once | ORAL | 0 refills | Status: AC

## 2020-09-14 NOTE — Progress Notes (Signed)
PRENATAL VISIT NOTE  Subjective:  Janice Chan is a 30 y.o. G3P0110 at [redacted]w[redacted]d being seen today for ongoing prenatal care.  She is currently monitored for the following issues for this high-risk pregnancy and has Supervision of high risk pregnancy, antepartum and History of IUFD on their problem list.  Patient reports  increased pelvic pressure and irregular but uncomfortable contractions, especially most of today. Lost her mucus plug a few days ago but has not noticed any pink tinged mucus. Was recently treated for chlamydia but does not know how she contracted it as both she and her partner claim fidelity. FOB present at appointment and willing to take partner treatment. Expressed some anxiety related to her IUFD and the reality of being able to bring this baby home .  Contractions: Irritability. Vag. Bleeding: Scant.  Movement: Present. Denies leaking of fluid.   The following portions of the patient's history were reviewed and updated as appropriate: allergies, current medications, past family history, past medical history, past social history, past surgical history and problem list.   Objective:   Vitals:   09/14/20 1516  BP: 114/79  Pulse: 96  Weight: 189 lb 3.2 oz (85.8 kg)    Fetal Status: Fetal Heart Rate (bpm): 135 Fundal Height: 39 cm Movement: Present  Presentation: Vertex  General:  Alert, oriented and cooperative. Patient is in no acute distress.  Skin: Skin is warm and dry. No rash noted.   Cardiovascular: Normal heart rate noted  Respiratory: Normal respiratory effort, no problems with respiration noted  Abdomen: Soft, gravid, appropriate for gestational age.  Pain/Pressure: Absent     Pelvic: Cervical exam performed in the presence of a chaperone Dilation: 4 Effacement (%): 70 Station: -2  Extremities: Normal range of motion.  Edema: None  Mental Status: Normal mood and affect. Normal behavior. Normal judgment and thought content.   Assessment and Plan:   Pregnancy: G3P0110 at [redacted]w[redacted]d 1. Supervision of low-risk pregnancy, third trimester - Doing well, feeling regular and vigorous fetal movement - Still able to do yoga daily, stopped working so able to rest more often  2. [redacted] weeks gestation of pregnancy - Routine OB care  3. Diet controlled gestational diabetes in the third trimester - Is not testing her glucose regularly and did not bring log. Has drastically changed her diet and started exercising (yoga and walking) daily.  - Initially wanted to discuss moving her IOL farther out, currently scheduled for tomorrow night at 11:45pm.  - Emphasized the importance of IOL at term if we cannot see her glucose readings to prove her new diet/lifestyle changes have actually controlled her levels. Reminded her of the risks of uncontrolled diabetes, pt amenable to IOL as scheduled (especially once her cervix was shown to be 4cm).  4. Chlamydia - Lengthy discussion on sexual transmission of chlamydia. Pt wanting to focus on impending labor/IOL, FOB agreed to partner treatment - prescription sent to pharmacy for expedited partner treatment - azithromycin (ZITHROMAX) 500 MG tablet; Take 2 tablets (1,000 mg total) by mouth once for 1 dose.  Dispense: 2 tablet; Refill: 0  5. History of IUFD - validation of feelings and reassurance provided  Term labor symptoms and general obstetric precautions including but not limited to vaginal bleeding, contractions, leaking of fluid and fetal movement were reviewed in detail with the patient. Please refer to After Visit Summary for other counseling recommendations.   Return in about 5 weeks (around 10/19/2020) for PP.  Future Appointments  Date Time Provider Department  Center  09/16/2020 12:00 AM [facility] [place] None   Gabriel Carina, CNM

## 2020-09-15 ENCOUNTER — Other Ambulatory Visit: Payer: Self-pay | Admitting: Obstetrics and Gynecology

## 2020-09-15 LAB — SARS CORONAVIRUS 2 (TAT 6-24 HRS): SARS Coronavirus 2: NEGATIVE

## 2020-09-16 ENCOUNTER — Inpatient Hospital Stay (HOSPITAL_COMMUNITY): Payer: Medicaid Other | Admitting: Anesthesiology

## 2020-09-16 ENCOUNTER — Other Ambulatory Visit: Payer: Self-pay

## 2020-09-16 ENCOUNTER — Inpatient Hospital Stay (HOSPITAL_COMMUNITY): Payer: Medicaid Other

## 2020-09-16 ENCOUNTER — Inpatient Hospital Stay (HOSPITAL_COMMUNITY)
Admission: AD | Admit: 2020-09-16 | Discharge: 2020-09-18 | DRG: 807 | Disposition: A | Discharge status: Home / Self Care | Payer: Medicaid Other | Attending: Obstetrics & Gynecology | Admitting: Obstetrics & Gynecology

## 2020-09-16 ENCOUNTER — Encounter (HOSPITAL_COMMUNITY): Payer: Self-pay | Admitting: Family Medicine

## 2020-09-16 ENCOUNTER — Inpatient Hospital Stay (HOSPITAL_COMMUNITY): Admission: AD | Admit: 2020-09-16 | Payer: Medicaid Other | Source: Home / Self Care | Admitting: Family Medicine

## 2020-09-16 DIAGNOSIS — O24420 Gestational diabetes mellitus in childbirth, diet controlled: Secondary | ICD-10-CM | POA: Diagnosis present

## 2020-09-16 DIAGNOSIS — Z3A40 40 weeks gestation of pregnancy: Secondary | ICD-10-CM

## 2020-09-16 DIAGNOSIS — Z87891 Personal history of nicotine dependence: Secondary | ICD-10-CM

## 2020-09-16 DIAGNOSIS — O0990 Supervision of high risk pregnancy, unspecified, unspecified trimester: Secondary | ICD-10-CM

## 2020-09-16 DIAGNOSIS — Z8759 Personal history of other complications of pregnancy, childbirth and the puerperium: Secondary | ICD-10-CM

## 2020-09-16 DIAGNOSIS — O4202 Full-term premature rupture of membranes, onset of labor within 24 hours of rupture: Secondary | ICD-10-CM | POA: Diagnosis not present

## 2020-09-16 DIAGNOSIS — Z3490 Encounter for supervision of normal pregnancy, unspecified, unspecified trimester: Secondary | ICD-10-CM | POA: Diagnosis present

## 2020-09-16 LAB — CBC
HCT: 36.1 % (ref 36.0–46.0)
Hemoglobin: 11.4 g/dL — ABNORMAL LOW (ref 12.0–15.0)
MCH: 25.1 pg — ABNORMAL LOW (ref 26.0–34.0)
MCHC: 31.6 g/dL (ref 30.0–36.0)
MCV: 79.5 fL — ABNORMAL LOW (ref 80.0–100.0)
Platelets: 250 10*3/uL (ref 150–400)
RBC: 4.54 MIL/uL (ref 3.87–5.11)
RDW: 15.4 % (ref 11.5–15.5)
WBC: 9.7 10*3/uL (ref 4.0–10.5)
nRBC: 0 % (ref 0.0–0.2)

## 2020-09-16 LAB — TYPE AND SCREEN
ABO/RH(D): B POS
Antibody Screen: NEGATIVE

## 2020-09-16 LAB — GLUCOSE, CAPILLARY
Glucose-Capillary: 90 mg/dL (ref 70–99)
Glucose-Capillary: 102 mg/dL — ABNORMAL HIGH (ref 70–99)

## 2020-09-16 LAB — HEPATITIS B SURFACE ANTIGEN: Hepatitis B Surface Ag: NONREACTIVE

## 2020-09-16 MED ORDER — ONDANSETRON HCL 4 MG/2ML IJ SOLN
4.0000 mg | Freq: Four times a day (QID) | INTRAMUSCULAR | Status: DC | PRN
  Administered 2020-09-16: 4 mg via INTRAVENOUS
  Filled 2020-09-16: qty 2

## 2020-09-16 MED ORDER — BENZOCAINE-MENTHOL 20-0.5 % EX AERO: 1.0000 "application " | INHALATION_SPRAY | CUTANEOUS | Status: DC | PRN

## 2020-09-16 MED ORDER — PHENYLEPHRINE 40 MCG/ML (10ML) SYRINGE FOR IV PUSH (FOR BLOOD PRESSURE SUPPORT): 80.0000 ug | PREFILLED_SYRINGE | INTRAVENOUS | Status: DC | PRN

## 2020-09-16 MED ORDER — TERBUTALINE SULFATE 1 MG/ML IJ SOLN: 0.2500 mg | Freq: Once | INTRAMUSCULAR | Status: DC | PRN

## 2020-09-16 MED ORDER — SIMETHICONE 80 MG PO CHEW: 80.0000 mg | CHEWABLE_TABLET | ORAL | Status: DC | PRN

## 2020-09-16 MED ORDER — DIBUCAINE (PERIANAL) 1 % EX OINT: 1.0000 "application " | TOPICAL_OINTMENT | CUTANEOUS | Status: DC | PRN

## 2020-09-16 MED ORDER — WITCH HAZEL-GLYCERIN EX PADS: 1.0000 "application " | MEDICATED_PAD | CUTANEOUS | Status: DC | PRN

## 2020-09-16 MED ORDER — OXYTOCIN-SODIUM CHLORIDE 30-0.9 UT/500ML-% IV SOLN
2.5000 [IU]/h | INTRAVENOUS | Status: DC
  Filled 2020-09-16: qty 500

## 2020-09-16 MED ORDER — EPHEDRINE 5 MG/ML INJ: 10.0000 mg | INTRAVENOUS | Status: DC | PRN

## 2020-09-16 MED ORDER — TRANEXAMIC ACID-NACL 1000-0.7 MG/100ML-% IV SOLN
INTRAVENOUS | Status: AC
  Filled 2020-09-16: qty 100

## 2020-09-16 MED ORDER — LIDOCAINE HCL (PF) 1 % IJ SOLN: 30.0000 mL | Freq: Once | INTRAMUSCULAR | Status: AC

## 2020-09-16 MED ORDER — FENTANYL-BUPIVACAINE-NACL 0.5-0.125-0.9 MG/250ML-% EP SOLN
12.0000 mL/h | EPIDURAL | Status: DC | PRN
  Administered 2020-09-16: 12 mL/h via EPIDURAL

## 2020-09-16 MED ORDER — LIDOCAINE HCL (PF) 1 % IJ SOLN
INTRAMUSCULAR | Status: AC
  Administered 2020-09-16: 30 mL
  Filled 2020-09-16: qty 30

## 2020-09-16 MED ORDER — IBUPROFEN 600 MG PO TABS
600.0000 mg | ORAL_TABLET | Freq: Four times a day (QID) | ORAL | Status: DC
  Administered 2020-09-16 – 2020-09-18 (×8): 600 mg via ORAL
  Filled 2020-09-16 (×8): qty 1

## 2020-09-16 MED ORDER — OXYCODONE HCL 5 MG PO TABS: 10.0000 mg | ORAL_TABLET | ORAL | Status: DC | PRN

## 2020-09-16 MED ORDER — ONDANSETRON HCL 4 MG PO TABS: 4.0000 mg | ORAL_TABLET | ORAL | Status: DC | PRN

## 2020-09-16 MED ORDER — ACETAMINOPHEN 325 MG PO TABS: 650.0000 mg | ORAL_TABLET | ORAL | Status: DC | PRN

## 2020-09-16 MED ORDER — PRENATAL MULTIVITAMIN CH
1.0000 | ORAL_TABLET | Freq: Every day | ORAL | Status: DC
  Administered 2020-09-17 – 2020-09-18 (×2): 1 via ORAL
  Filled 2020-09-16 (×2): qty 1

## 2020-09-16 MED ORDER — DIPHENHYDRAMINE HCL 25 MG PO CAPS: 25.0000 mg | ORAL_CAPSULE | Freq: Four times a day (QID) | ORAL | Status: DC | PRN

## 2020-09-16 MED ORDER — TRANEXAMIC ACID-NACL 1000-0.7 MG/100ML-% IV SOLN
1000.0000 mg | INTRAVENOUS | Status: AC
  Administered 2020-09-16: 1000 mg via INTRAVENOUS

## 2020-09-16 MED ORDER — COCONUT OIL OIL: 1.0000 "application " | TOPICAL_OIL | Status: DC | PRN

## 2020-09-16 MED ORDER — DIPHENHYDRAMINE HCL 50 MG/ML IJ SOLN
12.5000 mg | INTRAMUSCULAR | Status: DC | PRN
Start: 2020-09-16 — End: 2020-09-16

## 2020-09-16 MED ORDER — OXYTOCIN-SODIUM CHLORIDE 30-0.9 UT/500ML-% IV SOLN: 1.0000 m[IU]/min | INTRAVENOUS | Status: DC

## 2020-09-16 MED ORDER — LACTATED RINGERS IV SOLN: INTRAVENOUS | Status: DC

## 2020-09-16 MED ORDER — TRANEXAMIC ACID-NACL 1000-0.7 MG/100ML-% IV SOLN: 1000.0000 mg | Freq: Once | INTRAVENOUS | Status: AC

## 2020-09-16 MED ORDER — FENTANYL CITRATE (PF) 100 MCG/2ML IJ SOLN: 50.0000 ug | INTRAMUSCULAR | Status: DC | PRN

## 2020-09-16 MED ORDER — OXYCODONE HCL 5 MG PO TABS
5.0000 mg | ORAL_TABLET | ORAL | Status: DC | PRN
  Administered 2020-09-17 – 2020-09-18 (×3): 5 mg via ORAL
  Filled 2020-09-16 (×3): qty 1

## 2020-09-16 MED ORDER — MISOPROSTOL 50MCG HALF TABLET
50.0000 ug | ORAL_TABLET | ORAL | Status: DC | PRN
  Administered 2020-09-16: 50 ug via BUCCAL
  Filled 2020-09-16: qty 1

## 2020-09-16 MED ORDER — TRANEXAMIC ACID-NACL 1000-0.7 MG/100ML-% IV SOLN
INTRAVENOUS | Status: AC
  Administered 2020-09-16: 1000 mg
  Filled 2020-09-16: qty 100

## 2020-09-16 MED ORDER — ONDANSETRON HCL 4 MG/2ML IJ SOLN
INTRAMUSCULAR | Status: AC
  Administered 2020-09-16: 4 mg
  Filled 2020-09-16: qty 2

## 2020-09-16 MED ORDER — LACTATED RINGERS IV SOLN: 500.0000 mL | INTRAVENOUS | Status: DC | PRN

## 2020-09-16 MED ORDER — OXYTOCIN BOLUS FROM INFUSION: 333.0000 mL | Freq: Once | INTRAVENOUS | Status: DC

## 2020-09-16 MED ORDER — SOD CITRATE-CITRIC ACID 500-334 MG/5ML PO SOLN: 30.0000 mL | ORAL | Status: DC | PRN

## 2020-09-16 MED ORDER — SENNOSIDES-DOCUSATE SODIUM 8.6-50 MG PO TABS
2.0000 | ORAL_TABLET | Freq: Every day | ORAL | Status: DC
  Administered 2020-09-17 – 2020-09-18 (×2): 2 via ORAL
  Filled 2020-09-16 (×2): qty 2

## 2020-09-16 MED ORDER — FENTANYL-BUPIVACAINE-NACL 0.5-0.125-0.9 MG/250ML-% EP SOLN: 12.0000 mL/h | EPIDURAL | Status: DC | PRN

## 2020-09-16 MED ORDER — FENTANYL-BUPIVACAINE-NACL 0.5-0.125-0.9 MG/250ML-% EP SOLN
EPIDURAL | Status: AC
  Filled 2020-09-16: qty 250

## 2020-09-16 MED ORDER — DIPHENHYDRAMINE HCL 50 MG/ML IJ SOLN: 12.5000 mg | INTRAMUSCULAR | Status: DC | PRN

## 2020-09-16 MED ORDER — ONDANSETRON HCL 4 MG/2ML IJ SOLN: 4.0000 mg | INTRAMUSCULAR | Status: DC | PRN

## 2020-09-16 MED ORDER — LIDOCAINE HCL (PF) 1 % IJ SOLN
INTRAMUSCULAR | Status: DC | PRN
  Administered 2020-09-16: 8 mL via EPIDURAL

## 2020-09-16 MED ORDER — LACTATED RINGERS IV SOLN: 500.0000 mL | Freq: Once | INTRAVENOUS | Status: DC

## 2020-09-16 MED ORDER — LIDOCAINE HCL (PF) 1 % IJ SOLN: 30.0000 mL | INTRAMUSCULAR | Status: DC | PRN

## 2020-09-16 NOTE — Anesthesia Preprocedure Evaluation (Signed)
Anesthesia Evaluation  Patient identified by MRN, date of birth, ID band Patient awake    Reviewed: Allergy & Precautions, H&P , NPO status , Patient's Chart, lab work & pertinent test results, reviewed documented beta blocker date and time   Airway Mallampati: II  TM Distance: >3 FB Neck ROM: full    Dental no notable dental hx. (+) Teeth Intact, Dental Advisory Given   Pulmonary neg pulmonary ROS, former smoker,    Pulmonary exam normal breath sounds clear to auscultation       Cardiovascular negative cardio ROS Normal cardiovascular exam Rhythm:regular Rate:Normal     Neuro/Psych negative neurological ROS  negative psych ROS   GI/Hepatic negative GI ROS, Neg liver ROS,   Endo/Other  diabetes, Gestational  Renal/GU negative Renal ROS  negative genitourinary   Musculoskeletal   Abdominal   Peds  Hematology negative hematology ROS (+)   Anesthesia Other Findings   Reproductive/Obstetrics (+) Pregnancy                             Anesthesia Physical Anesthesia Plan  ASA: 2  Anesthesia Plan: Epidural   Post-op Pain Management:    Induction:   PONV Risk Score and Plan: 2  Airway Management Planned: Natural Airway  Additional Equipment: None  Intra-op Plan:   Post-operative Plan:   Informed Consent: I have reviewed the patients History and Physical, chart, labs and discussed the procedure including the risks, benefits and alternatives for the proposed anesthesia with the patient or authorized representative who has indicated his/her understanding and acceptance.       Plan Discussed with: Anesthesiologist  Anesthesia Plan Comments:         Anesthesia Quick Evaluation

## 2020-09-16 NOTE — Discharge Summary (Addendum)
Postpartum Discharge Summary      Patient Name: Janice Chan DOB: [birth year] MRN: [id number]  Date of admission: 09/16/2020 Delivery date:09/16/2020  Delivering provider: Gaylan Gerold R  Date of discharge: 09/18/2020  Admitting diagnosis: Encounter for induction of labor [Z34.90] Intrauterine pregnancy: [redacted]w[redacted]d    Secondary diagnosis:  Active Problems:   Encounter for induction of labor  Additional problems: None    Discharge diagnosis: Term Pregnancy Delivered and GDM A1                                              Post partum procedures: None Augmentation: Cytotec Complications: None  Hospital course: Induction of Labor With Vaginal Delivery   30y.o. yo [id number] admitted to the hospital 09/16/2020 for induction of labor.  Indication for induction: A1 DM.  Patient had an uncomplicated labor course as follows: Membrane Rupture Time/Date: 7:50 AM ,09/16/2020   Delivery Method:Vaginal, Spontaneous  Episiotomy: None  Lacerations:  1st degree;Perineal;Labial  Details of delivery can be found in separate delivery note.  Patient had a routine postpartum course. Patient is discharged home 09/18/20.  Newborn Data: Birth date:09/16/2020  Birth time:10:45 AM  Gender:Female  Living status:Living  Apgars:7 ,9  Weight:3549 g   Magnesium Sulfate received: No BMZ received: No Rhophylac:N/A MMR:Yes T-DaP:Given prenatally Flu: No Transfusion:No  Physical exam  Vitals:   09/17/20 0408 09/17/20 1407 09/17/20 2250 09/18/20 0516  BP: 117/68 (!) 111/91 114/78 123/82  Pulse: 60 61 (!) 56 75  Resp: '16 18 18 18  ' Temp: 98.2 F (36.8 C) 97.9 F (36.6 C) 97.9 F (36.6 C) 98 F (36.7 C)  TempSrc: Oral Oral Oral Oral  SpO2: 98% 99% 100% 100%  Weight:      Height:       General: alert, cooperative, and no distress Lochia: appropriate Uterine Fundus: firm Incision:n/a DVT Evaluation: No evidence of DVT seen on physical exam. Labs: Lab Results  Component Value Date    WBC 13.2 (H) 09/17/2020   HGB 9.4 (L) 09/17/2020   HCT 28.7 (L) 09/17/2020   MCV 77.8 (L) 09/17/2020   PLT 200 09/17/2020   CMP Latest Ref Rng & Units 09/17/2020  Glucose 70 - 99 mg/dL 77  Total Protein 6.0 - 8.3 g/dL -  Total Bilirubin 0.3 - 1.2 mg/dL -  Alkaline Phos 47 - 119 units/L -  AST 0 - 37 units/L -  ALT 0 - 35 units/L -   Edinburgh Score: Edinburgh Postnatal Depression Scale Screening Tool 09/16/2020  I have been able to laugh and see the funny side of things. 0  I have looked forward with enjoyment to things. 0  I have blamed myself unnecessarily when things went wrong. 0  I have been anxious or worried for no good reason. 1  I have felt scared or panicky for no good reason. 0  Things have been getting on top of me. 0  I have been so unhappy that I have had difficulty sleeping. 0  I have felt sad or miserable. 0  I have been so unhappy that I have been crying. 0  The thought of harming myself has occurred to me. 0  Edinburgh Postnatal Depression Scale Total 1     After visit meds:  Allergies as of 09/18/2020   No Known Allergies  Medication List     TAKE these medications    ibuprofen 600 MG tablet Commonly known as: ADVIL Take 1 tablet (600 mg total) by mouth every 6 (six) hours.   M-Natal Plus 27-1 MG Tabs Take by mouth.         Discharge home in stable condition Infant Feeding: Breast Infant Disposition:home with mother Discharge instruction: per After Visit Summary and Postpartum booklet. Activity: Advance as tolerated. Pelvic rest for 6 weeks.  Diet: routine diet Future Appointments:4 wks for PP visit Follow up Visit:  [place] for [place] at [facility]. Schedule an appointment as soon as possible for a visit in 4 week(s).   Specialty: Obstetrics and Gynecology Contact information: [place] [phone]                 Please  schedule this patient for a In person postpartum visit in 4 weeks with the following provider:  Gaylan Gerold, CNM . Additional Postpartum F/U: None   Low risk pregnancy complicated by: GDM Delivery mode:  Vaginal, Spontaneous  Anticipated Birth Control:  Unsure   09/18/2020 Hansel Feinstein, CNM

## 2020-09-16 NOTE — Progress Notes (Signed)
Labor Progress Note Janice Chan is a 30 y.o. G3P0110 at [redacted]w[redacted]d presented for term IOL for A1GDM with a history of IUFD delivery at 28wks in her last pregnancy.   S:  Called to room by RN after a late decel, pt's sister reported SROM "about 67min ago" of mec stained fluid. Pt resting in bed, comfortable with epidural. FOB and sister at bedside for support.   O:  BP 114/67   Pulse (!) 59   Temp 97.9 F (36.6 C) (Oral)   Resp 16   Ht 5\' 5"  (1.651 m)   Wt 191 lb 6.4 oz (86.8 kg)   LMP 12/10/2019 (Exact Date)   SpO2 100%   BMI 31.85 kg/m  EFM: baseline 125 bpm/ moderate variability/ 15x15 accels/ two late decels until repositioned, none now Toco/IUPC: q60min SVE: Dilation: 8 Effacement (%): 90 Station: 0 Presentation: Vertex Exam by:: Erla Bacchi, CNM 5cm at admission, now in active labor after one dose of cytotec.  A/P: 30 y.o. G3P0110 [redacted]w[redacted]d  1. Labor: Active, progressing 2. FWB: Cat 1 3. Pain: well-controlled with epidural and family support 4. A1GDM: glucose stable, q4 CBG  Laboring well off cytotec, will begin pitocin titration but anticipate position changes will facilitate SVD without further augmentation. Pt repositioned into right lateral with peanut ball.  Gabriel Carina, CNM 8:44 AM

## 2020-09-16 NOTE — Anesthesia Postprocedure Evaluation (Signed)
Anesthesia Post Note  Patient: Janice Chan  Procedure(s) Performed: AN AD [facility]     Patient location during evaluation: Mother Baby Anesthesia Type: Epidural Level of consciousness: awake and alert Pain management: pain level controlled Vital Signs Assessment: post-procedure vital signs reviewed and stable Respiratory status: spontaneous breathing, nonlabored ventilation and respiratory function stable Cardiovascular status: stable Postop Assessment: no headache, no backache and epidural receding Anesthetic complications: no   No notable events documented.  Last Vitals:  Vitals:   09/16/20 1433 09/16/20 1838  BP: 124/73 108/76  Pulse: 73 61  Resp:  18  Temp: 36.6 C 36.8 C  SpO2: 100% 100%    Last Pain:  Vitals:   09/16/20 1933  TempSrc:   PainSc: 0-No pain   Pain Goal:                   Rayvon Char

## 2020-09-16 NOTE — Lactation Note (Signed)
This note was copied from a baby's chart. Lactation Consultation Note  Patient Name: Girl [place] Today's Date: 09/16/2020 Reason for consult: L&D Initial assessment;1st time breastfeeding;[place] Age:29 hours  P1, Baby latched upon entry with intermittent sucks and swallows. Feed on demand with cues.  Goal 8-12+ times per day after first 24 hrs.  Place baby STS if not cueing.  Lactation to follow up on MBU.  Maternal Data Does the patient have breastfeeding experience prior to this delivery?: No  Feeding Mother's Current Feeding Choice: Breast Milk  LATCH Score Latch: Repeated attempts needed to sustain latch, nipple held in mouth throughout feeding, stimulation needed to elicit sucking reflex.  Audible Swallowing: A few with stimulation  Type of Nipple: Everted at rest and after stimulation  Comfort (Breast/Nipple): Soft / non-tender  Hold (Positioning): Full assist, staff holds infant at breast  LATCH Score: 6    Interventions Interventions: Education    Consult Status Consult Status: Follow-up Date: 09/16/20 Follow-up type: In-patient    Carlye Grippe RN [place] 09/16/2020, 12:42 PM

## 2020-09-16 NOTE — Anesthesia Procedure Notes (Signed)
Epidural Patient location during procedure: OB Start time: 09/16/2020 6:11 AM End time: 09/16/2020 6:18 AM  Staffing Anesthesiologist: Janeece Riggers, MD  Preanesthetic Checklist Completed: patient identified, IV checked, site marked, risks and benefits discussed, surgical consent, monitors and equipment checked, pre-op evaluation and timeout performed  Epidural Patient position: sitting Prep: DuraPrep and site prepped and draped Patient monitoring: continuous pulse ox and blood pressure Approach: midline Location: L3-L4 Injection technique: LOR air  Needle:  Needle type: Tuohy  Needle gauge: 17 G Needle length: 9 cm and 9 Needle insertion depth: 6 cm Catheter type: closed end flexible Catheter size: 19 Gauge Catheter at skin depth: 11 cm Test dose: negative  Assessment Events: blood not aspirated, injection not painful, no injection resistance, no paresthesia and negative IV test

## 2020-09-16 NOTE — H&P (Signed)
OBSTETRIC ADMISSION HISTORY AND PHYSICAL  Janice Chan is a 30 y.o. female G49P0110 with IUP at 32w1dby LMP presenting for IOL for A1gdm. She reports +FMs, No LOF, no VB, no blurry vision, headaches or peripheral edema, and RUQ pain.  She plans on breast feeding. She is undecided for birth control. [facility]  Dating: By LMP --->  Estimated Date of Delivery: 09/15/20  Sono:    _0 , CWD, normal anatomy, cephalic presentation, 33086V 72% EFW   Prenatal History/Complications:   -AH8ION- uncertain control, not checking CBG at home but endorses making dietary/lifestyle changes  -Chlamydia Pos on 09/07/20, s/p treatment -Hx IUFD at 243weeks  -Rubella Nonimmune   Past Medical History: Past Medical History:  Diagnosis Date   BV (bacterial vaginosis)    Chlamydia    at least 3 occasions, most recently 2013   Chlamydia 2013   Contraception management 09/12/2010   Fibroid    Fibroids    Obesity    Ovarian cyst    UTI (urinary tract infection)     Past Surgical History: Past Surgical History:  Procedure Laterality Date   ANKLE SURGERY  2007   INDUCED ABORTION     WISDOM TOOTH EXTRACTION  2011    Obstetrical History: OB History     Gravida  3   Para  1   Term  0   Preterm  1   AB  1   Living  0      SAB  0   IAB  0   Ectopic  0   Multiple  0   Live Births              Social History Social History   Socioeconomic History   Marital status: Single    Spouse name: Not on file   Number of children: Not on file   Years of education: Not on file   Highest education level: Not on file  Occupational History   Not on file  Tobacco Use   Smoking status: Former    Pack years: 0.00    Types: Cigarettes    Quit date: 06/11/2020    Years since quitting: 0.2   Smokeless tobacco: Never  Vaping Use   Vaping Use: Never used  Substance and Sexual Activity   Alcohol use: Not Currently    Comment: occasional   Drug use: Not  Currently    Types: Marijuana    Comment: not since + pregnancy   Sexual activity: Yes    Birth control/protection: None  Other Topics Concern   Not on file  Social History Narrative   Not on file   Social Determinants of Health   Financial Resource Strain: Not on file  Food Insecurity: No Food Insecurity   Worried About Running Out of Food in the Last Year: Never true   [place] the Last Year: Never true  Transportation Needs: No Transportation Needs   Lack of Transportation (Medical): No   Lack of Transportation (Non-Medical): No  Physical Activity: Not on file  Stress: Not on file  Social Connections: Not on file    Family History: Family History  Problem Relation Age of Onset   Asthma Sister    Breast cancer Maternal Aunt    Prostate cancer Maternal Grandfather     Allergies: No Known Allergies  Medications Prior to Admission  Medication Sig Dispense Refill Last Dose   Prenatal Vit-Fe Fumarate-FA (  M-NATAL PLUS) 27-1 MG TABS Take by mouth.        Review of Systems   All systems reviewed and negative except as stated in HPI  Last menstrual period 12/10/2019. General appearance: alert, cooperative, and appears stated age Lungs: clear to auscultation bilaterally Abdomen: soft, non-tender; bowel sounds normal   Presentation: cephalic Fetal monitoring baseline 130, mod variability, pos accels, no decels  Uterine activity irritability  Dilation: 5 Effacement (%): 70 Station: -1 Exam by:: West Pugh, RN   Prenatal labs: ABO, Rh: --/--/PENDING (07/07 0128) Antibody: PENDING (07/07 0128) Rubella:   RPR: Non Reactive (04/18 0904)  HBsAg:    HIV: Non Reactive (04/18 0904)  GBS: Negative/-- (06/28 1109)  2 hr Glucola abnormal Genetic screening  lr NIPS, female  Anatomy US normal   Prenatal Transfer Tool  Maternal Diabetes: Yes:  Diabetes Type:  Diet controlled Genetic Screening: Normal Maternal Ultrasounds/Referrals: Normal Fetal Ultrasounds  or other Referrals:  MFM Maternal Substance Abuse:  No Significant Maternal Medications:  None Significant Maternal Lab Results: Group B Strep negative  Results for orders placed or performed during the hospital encounter of 09/16/20 (from the past 24 hour(s))  Type and screen   Collection Time: 09/16/20  1:28 AM  Result Value Ref Range   ABO/RH(D) PENDING    Antibody Screen PENDING    Sample Expiration      09/19/2020,2359 Performed at [facility], [place] [address], [place], [place] [address]   CBC   Collection Time: 09/16/20  1:38 AM  Result Value Ref Range   WBC 9.7 4.0 - 10.5 K/uL   RBC 4.54 3.87 - 5.11 MIL/uL   Hemoglobin 11.4 (L) 12.0 - 15.0 g/dL   HCT 36.1 36.0 - 46.0 %   MCV 79.5 (L) 80.0 - 100.0 fL   MCH 25.1 (L) 26.0 - 34.0 pg   MCHC 31.6 30.0 - 36.0 g/dL   RDW 15.4 11.5 - 15.5 %   Platelets 250 150 - 400 K/uL   nRBC 0.0 0.0 - 0.2 %    Patient Active Problem List   Diagnosis Date Noted   Encounter for induction of labor 09/16/2020   Diet controlled gestational diabetes mellitus (GDM) in third trimester 09/14/2020   Supervision of high risk pregnancy, antepartum 04/05/2020   History of IUFD 04/05/2020    Assessment/Plan:  [place] is a 30 y.o. G3P0110 at 64w1dhere for IOL for A1GDM  #Labor: discussed induction methods, will give cytotec and reevaluate in four hours.  #Pain: Per patient request #A1GDM: has not been checking blood sugars however has reported significant change in diet/exercise. EFW 72%ile 3250g at 35w0dCBG q4hours.  #Chlamydia Positive 09/07/20: s/p azithromycin. Needs TOC at pp visit.  #Rubella Non-imm: MMR pp  #FWB: Cat I  #ID:  Gbs neg  #MOF: breast #MOC:undecided   JuJanet BerlinMD  09/16/2020, 2:10 AM

## 2020-09-16 NOTE — Progress Notes (Signed)
Labor Progress Note Janice Chan is a 30 y.o. G3P0110 at [redacted]w[redacted]d presented for IOL for A1GDM S: Ready for a nap now that she has epidural   O:  BP (!) 138/94   Pulse 67   Resp 16   Ht 5\' 5"  (1.651 m)   Wt 86.8 kg   LMP 12/10/2019 (Exact Date)   SpO2 100%   BMI 31.85 kg/m  EFM: baseline 125/mod variability/pos accels/ no decels   CVE: Dilation: 7.5 Effacement (%): 90 Station: -1 Presentation: Vertex Exam by:: West Pugh, RN   A&P: 30 y.o. [phone] [redacted]w[redacted]d here for IOL for A1GDM #Labor: Progressing well. S/p Cytotec x1. Continuing to contract and make cervical change. Expectant mgmt for now. Can augment prn. Anticipate SVD.  #A1GDM: CBG q4, have been appropriate.   #Pain: Epidural #FWB:  #GBS negative   [name], MD 6:54 AM

## 2020-09-17 LAB — CBC
HCT: 28.7 % — ABNORMAL LOW (ref 36.0–46.0)
Hemoglobin: 9.4 g/dL — ABNORMAL LOW (ref 12.0–15.0)
MCH: 25.5 pg — ABNORMAL LOW (ref 26.0–34.0)
MCHC: 32.8 g/dL (ref 30.0–36.0)
MCV: 77.8 fL — ABNORMAL LOW (ref 80.0–100.0)
Platelets: 200 10*3/uL (ref 150–400)
RBC: 3.69 MIL/uL — ABNORMAL LOW (ref 3.87–5.11)
RDW: 15.1 % (ref 11.5–15.5)
WBC: 13.2 10*3/uL — ABNORMAL HIGH (ref 4.0–10.5)
nRBC: 0 % (ref 0.0–0.2)

## 2020-09-17 LAB — RPR: RPR Ser Ql: NONREACTIVE

## 2020-09-17 LAB — GLUCOSE, CAPILLARY: Glucose-Capillary: 75 mg/dL (ref 70–99)

## 2020-09-17 LAB — GLUCOSE, RANDOM: Glucose, Bld: 77 mg/dL (ref 70–99)

## 2020-09-17 MED ORDER — FERROUS SULFATE 325 (65 FE) MG PO TABS
325.0000 mg | ORAL_TABLET | ORAL | Status: DC
  Administered 2020-09-17: 325 mg via ORAL
  Filled 2020-09-17: qty 1

## 2020-09-17 MED ORDER — MEASLES, MUMPS & RUBELLA VAC IJ SOLR: 0.5000 mL | Freq: Once | INTRAMUSCULAR | Status: DC

## 2020-09-17 NOTE — Progress Notes (Signed)
POSTPARTUM PROGRESS NOTE  Subjective: Janice Chan is a 30 y.o. [phone] s/p  SVD at [redacted]w[redacted]d.  She reports she doing well. No acute events overnight. She denies any problems with ambulating, voiding or po intake. Denies nausea or vomiting. She has passed flatus. Pain is well controlled.  Lochia is mild.  Objective: Blood pressure 117/68, pulse 60, temperature 98.2 F (36.8 C), temperature source Oral, resp. rate 16, height 5\' 5"  (1.651 m), weight 86.8 kg, last menstrual period 12/10/2019, SpO2 98 %, unknown if currently breastfeeding.  Physical Exam:  General: alert, cooperative and no distress Chest: no respiratory distress Abdomen: soft, non-tender  Uterine Fundus: firm and at level of umbilicus Extremities: No calf swelling or tenderness  no edema  Recent Labs    09/16/20 0138 09/17/20 0544  HGB 11.4* 9.4*  HCT 36.1 28.7*    Assessment/Plan: Janice Chan is a 30 y.o. [id number] s/p SVD at [redacted]w[redacted]d.  Routine Postpartum Care: Doing well, pain well-controlled.  -- Continue routine care, lactation support  -- Contraception: declines -- Feeding: breast  -History of A1GDM:  AM CBG 75   Dispo: Plan for discharge PPD#2.  [name], MD OB Fellow, Faculty [facility] 09/17/2020 7:14 AM

## 2020-09-18 MED ORDER — IBUPROFEN 600 MG PO TABS: 600.0000 mg | ORAL_TABLET | Freq: Four times a day (QID) | ORAL | 0 refills | Status: DC

## 2020-09-28 ENCOUNTER — Telehealth (HOSPITAL_COMMUNITY): Payer: Self-pay | Admitting: *Deleted

## 2020-09-28 NOTE — Telephone Encounter (Signed)
Left message to return nurse call.  Odis Hollingshead, RN 09-28-2020 at 11:23am

## 2020-10-07 NOTE — Telephone Encounter (Signed)
Call to patient to review forms request. Left message request is in process.

## 2020-11-02 ENCOUNTER — Ambulatory Visit: Payer: Medicaid Other | Admitting: Certified Nurse Midwife

## 2020-12-10 ENCOUNTER — Encounter: Payer: Self-pay | Admitting: Radiology

## 2020-12-15 ENCOUNTER — Ambulatory Visit (INDEPENDENT_AMBULATORY_CARE_PROVIDER_SITE_OTHER): Payer: Medicaid Other | Admitting: Certified Nurse Midwife

## 2020-12-15 ENCOUNTER — Other Ambulatory Visit: Payer: Self-pay

## 2020-12-15 DIAGNOSIS — Z3202 Encounter for pregnancy test, result negative: Secondary | ICD-10-CM

## 2020-12-15 LAB — POCT PREGNANCY, URINE: Preg Test, Ur: NEGATIVE

## 2020-12-15 NOTE — Progress Notes (Signed)
[place] Partum Visit Note  Janice Chan is a 30 y.o. [phone] female who presents for a postpartum visit. She is  13  weeks postpartum following a normal spontaneous vaginal delivery.  I have fully reviewed the prenatal and intrapartum course. The delivery was at 40-1 gestational weeks.  Anesthesia: epidural. Postpartum course has been normal and uncomplicated. Baby is doing well. Baby is feeding by both breast and bottle - Enfamil gentle ease . Bleeding no bleeding. Bowel function is normal. Bladder function is normal. Patient is very rarely sexually active. Contraception method is none, planning to use condoms if IC becomes regular. Postpartum depression screening: negative.   Upstream - 12/17/20 1937       Pregnancy Intention Screening   Does the patient want to become pregnant in the next year? No    Does the patient's partner want to become pregnant in the next year? No    Would the patient like to discuss contraceptive options today? No      Contraception Wrap Up   Current Method Female Condom    End Method Female Condom    Contraception Counseling Provided Yes            The pregnancy intention screening data noted above was reviewed. Potential methods of contraception were discussed. The patient elected to proceed with Female Condom.   Edinburgh Postnatal Depression Scale - 12/15/20 1333       Edinburgh Postnatal Depression Scale:  In the Past 7 Days   I have been able to laugh and see the funny side of things. 0    I have looked forward with enjoyment to things. 0    I have blamed myself unnecessarily when things went wrong. 0    I have been anxious or worried for no good reason. 0    I have felt scared or panicky for no good reason. 0    Things have been getting on top of me. 0    I have been so unhappy that I have had difficulty sleeping. 0    I have felt sad or miserable. 0    I have been so unhappy that I have been crying. 0    The thought of harming myself has  occurred to me. 0    Edinburgh Postnatal Depression Scale Total 0             Health Maintenance Due  Topic Date Due   COVID-19 Vaccine (1) Never done   URINE MICROALBUMIN  Never done   Hepatitis C Screening  Never done   INFLUENZA VACCINE  10/11/2020    The following portions of the patient's history were reviewed and updated as appropriate: allergies, current medications, past family history, past medical history, past social history, past surgical history, and problem list.  Review of Systems Pertinent items noted in HPI and remainder of comprehensive ROS otherwise negative.  Objective:  BP (!) 137/93   Pulse 85   Wt 172 lb 6.4 oz (78.2 kg)   LMP 11/25/2020 (Exact Date)   Breastfeeding Yes   BMI 28.69 kg/m    General:  alert, cooperative, appears stated age, and no distress   Breasts:  normal  Lungs: Normal effort  Heart:  regular rate and rhythm  Abdomen: Soft, nontender    Wound N/A  GU exam:  not indicated       Assessment:  Postpartum care and examination of lactating mother  Plan:   Essential components of care per ACOG  recommendations:  1.  Mood and well being: Patient with negative depression screening today. Reviewed local resources for support.  - Patient tobacco use? No.   - hx of drug use? No.    2. Infant care and feeding:  -Patient currently breastmilk feeding? Yes. Reviewed importance of draining breast regularly to support lactation.  -Social determinants of health (SDOH) reviewed in EPIC. No concerns  3. Sexuality, contraception and birth spacing - Patient does not want a pregnancy in the next year.  Desired family size is currently unknown.   - Reviewed forms of contraception in tiered fashion. Patient desired abstinence, condoms today.   - Discussed birth spacing of 18 months  4. Sleep and fatigue -Encouraged family/partner/community support of 4 hrs of uninterrupted sleep to help with mood and fatigue  5. Physical Recovery  -  Discussed patients delivery and complications. She describes her labor as good. - Patient had a Vaginal, no problems at delivery. Patient had a 1st degree laceration. Perineal healing reviewed. Patient expressed understanding - Patient has urinary incontinence? No. - Patient is safe to resume physical and sexual activity  6.  Health Maintenance - HM due items addressed Yes - Last pap smear  Diagnosis  Date Value Ref Range Status  08/07/2018   Final   NEGATIVE FOR INTRAEPITHELIAL LESIONS OR MALIGNANCY.   Pap smear not done at today's visit.  -Breast Cancer screening indicated? No.   7. Chronic Disease/Pregnancy Condition follow up: None - PCP follow up as needed  Gabriel Carina, [place] [facility] [place]

## 2020-12-17 ENCOUNTER — Encounter: Payer: Self-pay | Admitting: Certified Nurse Midwife

## 2021-07-16 ENCOUNTER — Other Ambulatory Visit: Payer: Self-pay | Admitting: Certified Nurse Midwife

## 2021-07-16 ENCOUNTER — Ambulatory Visit
Admission: RE | Admit: 2021-07-16 | Discharge: 2021-07-16 | Disposition: A | Discharge status: Home / Self Care | Payer: Medicaid Other | Source: Ambulatory Visit | Attending: Certified Nurse Midwife | Admitting: Certified Nurse Midwife

## 2021-07-16 DIAGNOSIS — Z1231 Encounter for screening mammogram for malignant neoplasm of breast: Secondary | ICD-10-CM

## 2021-11-07 ENCOUNTER — Encounter: Payer: Self-pay | Admitting: Certified Nurse Midwife

## 2022-05-25 IMAGING — MG MM DIGITAL SCREENING BILAT W/ TOMO AND CAD
8 series · 8 of 24 positions shown · non-contrast
Comparison: None available.

CLINICAL DATA: Screening. Strong family history of breast cancer.
Patient's mother was diagnosed with breast cancer at 39 and her
maternal aunt at 34.

EXAM:
DIGITAL SCREENING BILATERAL MAMMOGRAM WITH TOMOSYNTHESIS AND CAD
TECHNIQUE: Bilateral screening digital craniocaudal and mediolateral oblique
mammograms were obtained. Bilateral screening digital breast
tomosynthesis was performed. The images were evaluated with
computer-aided detection.

[L CC synth-2D]
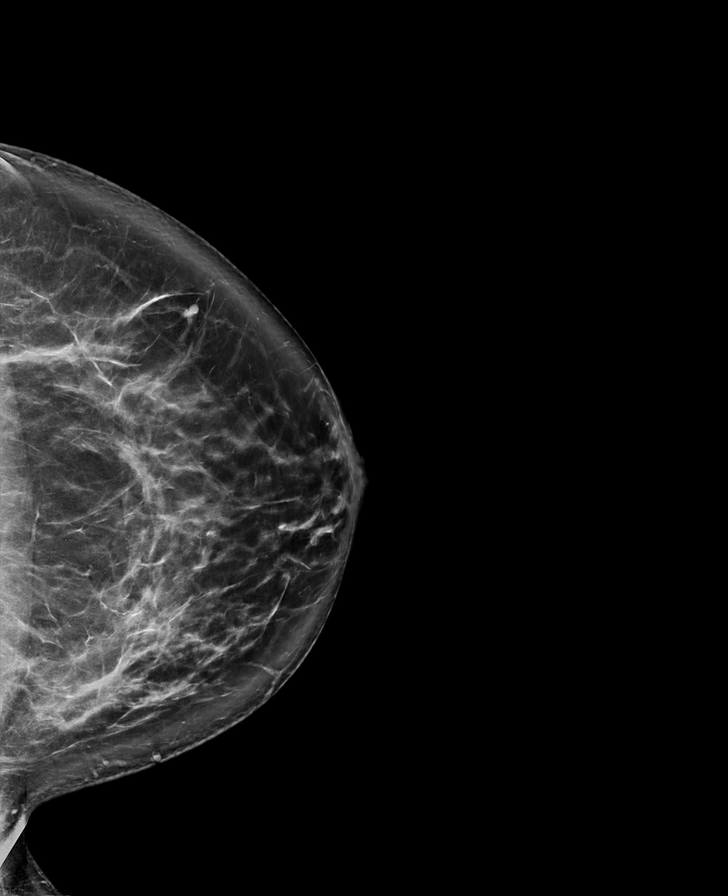

[R MLO synth-2D]
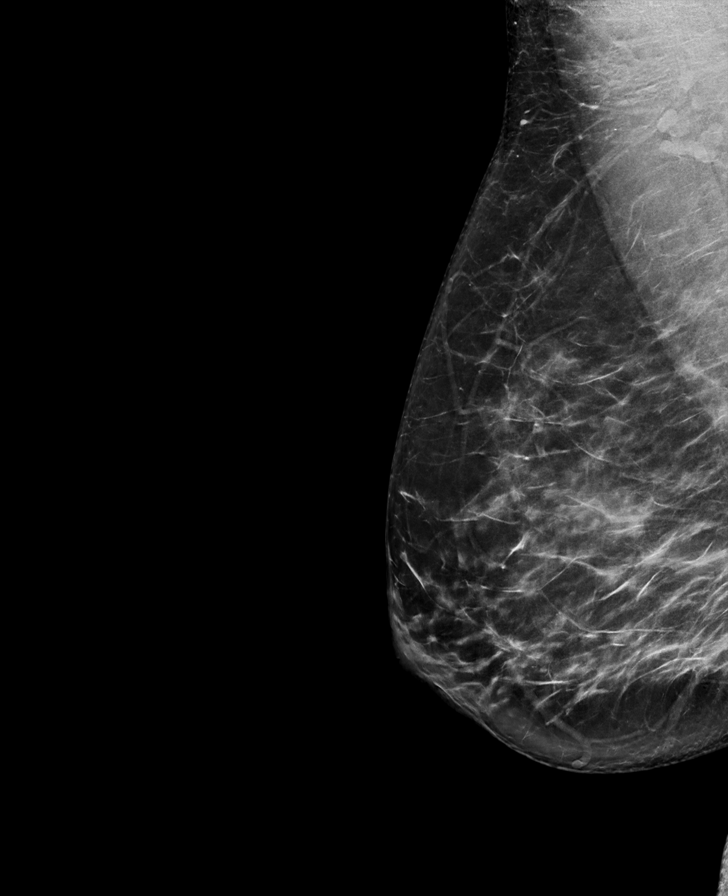

[L MLO synth-2D]
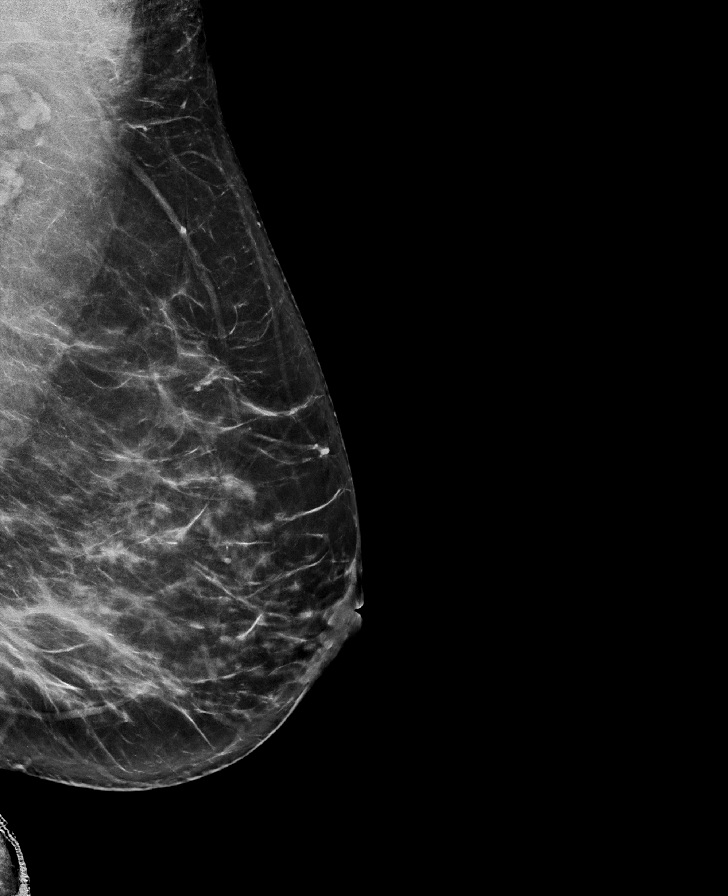

[R CC synth-2D]
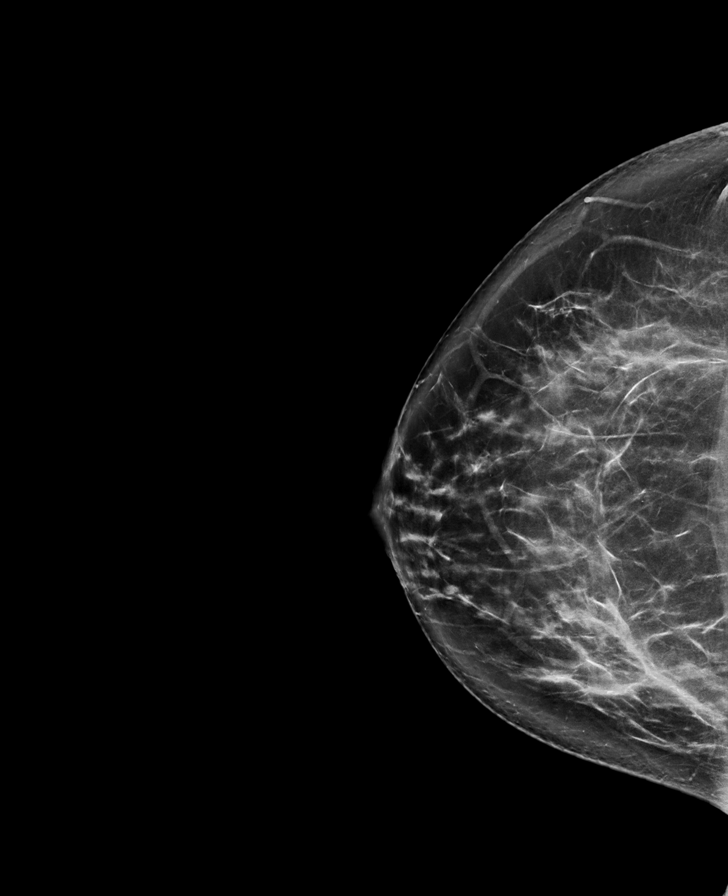

[R CC tomo · tomo slice 43/85.0]
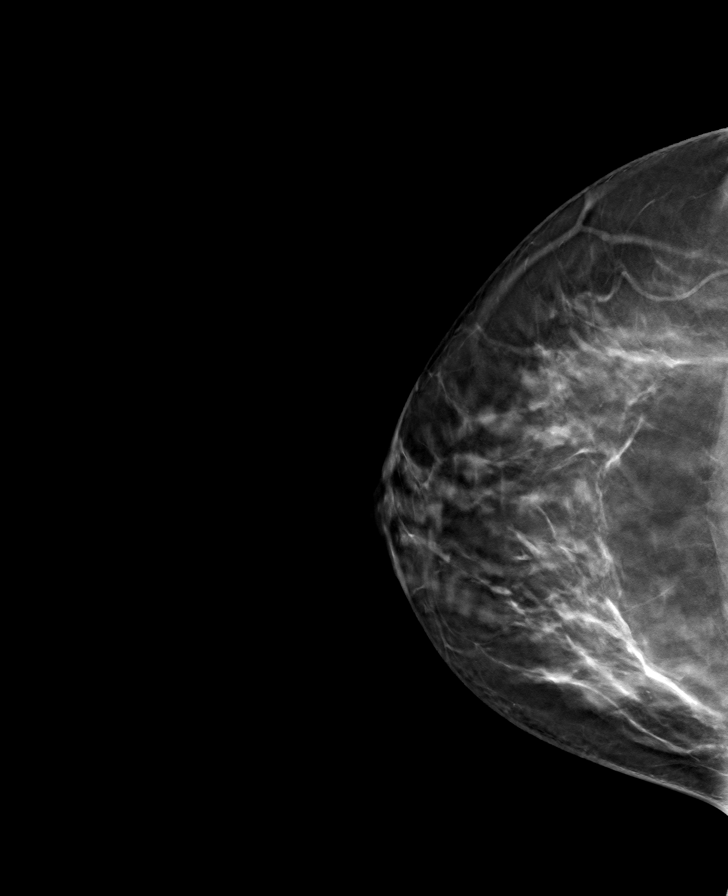

[L CC tomo · tomo slice 45/88.0]
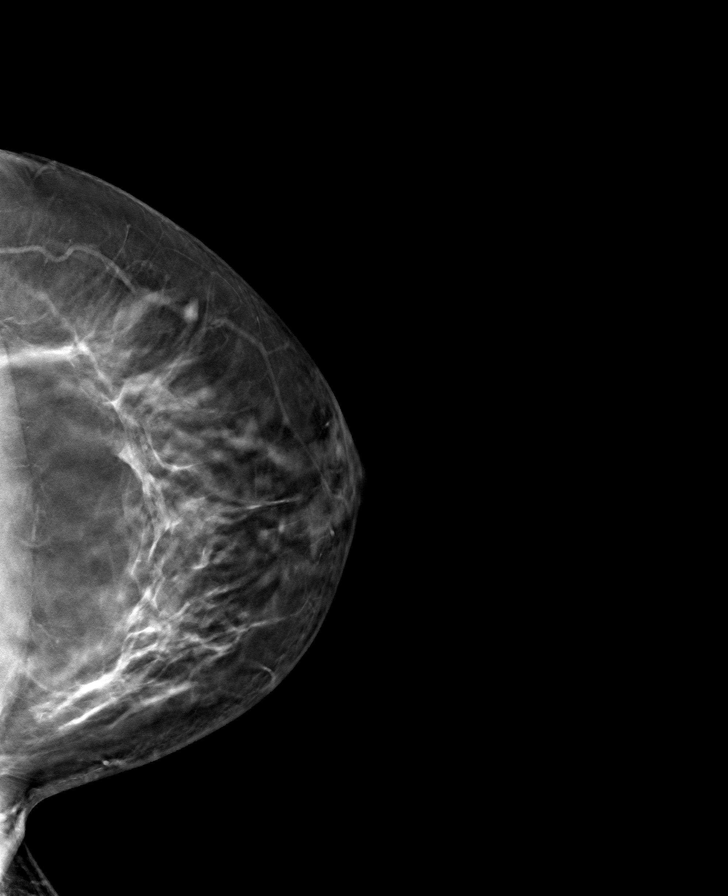

[R MLO tomo · tomo slice 41/81.0]
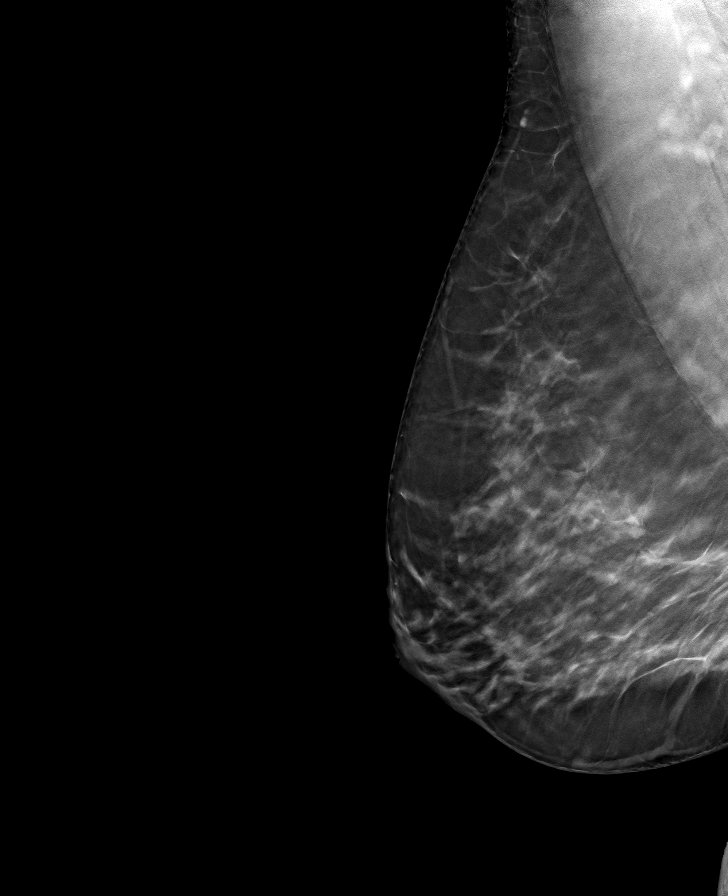

[L MLO tomo · tomo slice 40/79.0]
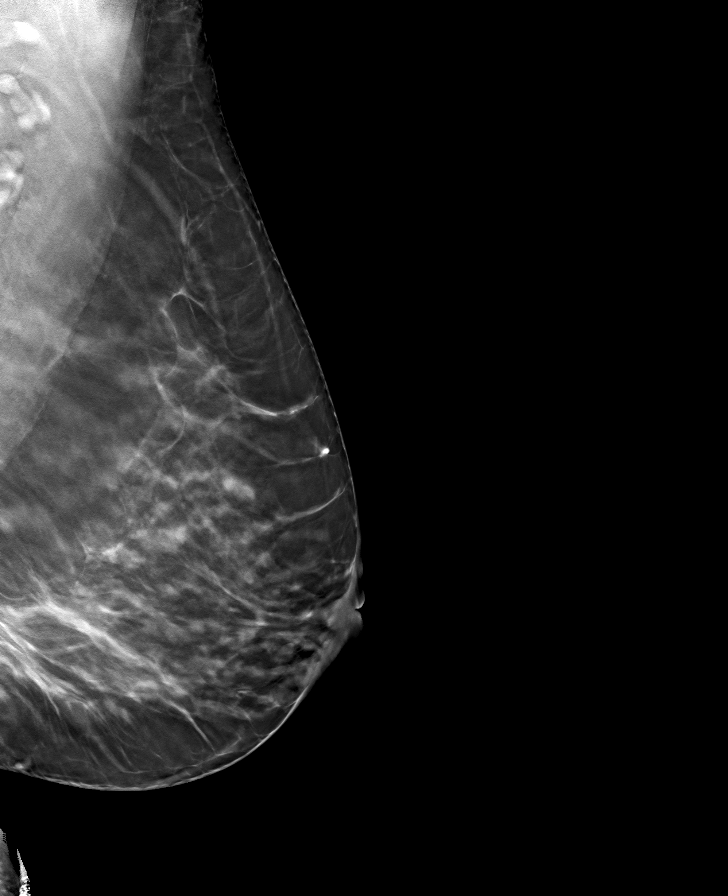

[8 of 24 positions shown; findings below may reference images not displayed]

ACR Breast Density Category c: The breast tissue is heterogeneously
dense, which may obscure small masses
FINDINGS: There are no findings suspicious for malignancy.
IMPRESSION: No mammographic evidence of malignancy. A result letter of this
screening mammogram will be mailed directly to the patient.

RECOMMENDATION:
Screening mammogram in 1 year is recommended.

The American Cancer Society recommends annual MRI and mammography in
patients with an estimated lifetime risk of developing breast cancer
greater than 20 - 25%, or who are known or suspected to be positive
for the breast cancer gene.

BI-RADS CATEGORY  1: Negative.

## 2022-10-16 ENCOUNTER — Other Ambulatory Visit: Payer: Self-pay | Admitting: Certified Nurse Midwife

## 2022-10-16 DIAGNOSIS — Z1231 Encounter for screening mammogram for malignant neoplasm of breast: Secondary | ICD-10-CM

## 2022-10-25 ENCOUNTER — Ambulatory Visit: Payer: Medicaid Other

## 2022-10-26 ENCOUNTER — Ambulatory Visit
Admission: RE | Admit: 2022-10-26 | Discharge: 2022-10-26 | Disposition: A | Discharge status: Home / Self Care | Payer: Medicaid Other | Source: Ambulatory Visit | Attending: Certified Nurse Midwife | Admitting: Certified Nurse Midwife

## 2022-10-26 DIAGNOSIS — Z1231 Encounter for screening mammogram for malignant neoplasm of breast: Secondary | ICD-10-CM

## 2022-12-14 ENCOUNTER — Ambulatory Visit: Payer: Medicaid Other | Admitting: Gastroenterology

## 2023-02-05 ENCOUNTER — Encounter: Payer: Self-pay | Admitting: Gastroenterology

## 2023-02-05 ENCOUNTER — Ambulatory Visit (INDEPENDENT_AMBULATORY_CARE_PROVIDER_SITE_OTHER): Payer: Medicaid Other | Admitting: Gastroenterology

## 2023-02-05 VITALS — BP 142/93 | HR 82 | Temp 98.5°F | Ht 65.5 in | Wt 175.0 lb

## 2023-02-05 DIAGNOSIS — R109 Unspecified abdominal pain: Secondary | ICD-10-CM

## 2023-02-05 DIAGNOSIS — R1013 Epigastric pain: Secondary | ICD-10-CM

## 2023-02-05 DIAGNOSIS — Z862 Personal history of diseases of the blood and blood-forming organs and certain disorders involving the immune mechanism: Secondary | ICD-10-CM

## 2023-02-05 MED ORDER — OMEPRAZOLE 40 MG PO CPDR: 40.0000 mg | DELAYED_RELEASE_CAPSULE | Freq: Every day | ORAL | 1 refills | Status: AC

## 2023-02-05 NOTE — Progress Notes (Unsigned)
Janice Mood MD, MRCP(U.K) [address]  [address]  [place], [place] [address]  Main: [phone]  Fax: [phone]   Gastroenterology Consultation  Referring Provider:     Theodis Shove, * Primary Care Physician:  Theodis Shove, DO Primary Gastroenterologist:  Dr. Wyline Chan  Reason for Consultation:     Self referred for abdominal pain         HPI:   Janice Chan is a 32 y.o. y/o female referred for consultation & management  by Dr. Lorrene Reid, Prince Solian, DO.   She says she has had epigastric pain going on for a year actually has improved over the past few months.  Usually occurs within 10 to 15 minutes of a meal dull in nature nonradiating no clear aggravating factors relieved by a bowel movement.  Most days in the month she feels that she is constipated has tried MiraLAX which has not helped.  Denies any heartburn.  Denies any NSAID use.  Denies any unintentional weight loss.  No family history of gastric cancer.  She uses hemp oil for the discomfort.  The pain and discomfort is worse when she eats fast food or greasy food.  No family history of gallbladder problems.  No recent CBC hemoglobin 2 years back showed microcytic anemia  Past Medical History:  Diagnosis Date   BV (bacterial vaginosis)    Chlamydia    at least 3 occasions, most recently 2013   Chlamydia 2013   Contraception management 09/12/2010   Diet controlled gestational diabetes mellitus (GDM) in third trimester 09/14/2020   Fibroid    Fibroids    Obesity    Ovarian cyst    Supervision of high risk pregnancy, antepartum 04/05/2020    Nursing Staff Provider Office Location  [facility] Dating    Language   English Anatomy US   normal but needs fup for growth due to IUFD Flu Vaccine  Declined Genetic Screen  NIPS:  LR  AFP:  Screen negative  First Screen:  Quad:   TDaP Vaccine  06/23/2020 Hgb A1C or  GTT Early: normal  Third trimester  COVID Vaccine Declines    LAB RESULTS  Rhogam   Blood  Type B/Positive/-- (01/10 0000)  Feeding Pl   UTI (urinary tract infection)     Past Surgical History:  Procedure Laterality Date   ANKLE SURGERY  2007   INDUCED ABORTION     WISDOM TOOTH EXTRACTION  2011    Prior to Admission medications   Medication Sig Start Date End Date Taking? Authorizing Provider  Prenatal Vit-Fe Fumarate-FA (M-NATAL PLUS) 27-1 MG TABS Take by mouth. 03/22/20   [provider]    Family History  Problem Relation Age of Onset   Breast cancer Mother 56   Asthma Sister    Breast cancer Maternal Aunt 34   Prostate cancer Maternal Grandfather      Social History   Tobacco Use   Smoking status: Former    Current packs/day: 0.00    Types: Cigarettes    Quit date: 06/11/2020    Years since quitting: 2.6   Smokeless tobacco: Never  Vaping Use   Vaping status: Never Used  Substance Use Topics   Alcohol use: Not Currently    Comment: occasional   Drug use: Not Currently    Types: Marijuana    Comment: not since + pregnancy    Allergies as of 02/05/2023   (No Known Allergies)    Review of Systems:  All systems reviewed and negative except where noted in HPI.   Physical Exam:  BP (!) 142/93 (BP Location: Left Arm, Patient Position: Sitting, Cuff Size: Normal)   Pulse 82   Temp 98.5 F (36.9 C) (Oral)   Ht 5' 5.5" (1.664 m)   Wt 175 lb (79.4 kg)   BMI 28.68 kg/m  No LMP recorded. Psych:  Alert and cooperative. Normal Chan and affect. General:   Alert,  Well-developed, well-nourished, pleasant and cooperative in NAD Head:  Normocephalic and atraumatic. Eyes:  Sclera clear, no icterus.   Conjunctiva pink. Ears:  Normal auditory acuity. Neck:  Supple; no masses or thyromegaly. Lungs:  Respirations even and unlabored.  Clear throughout to auscultation.   No wheezes, crackles, or rhonchi. No acute distress. Heart:  Regular rate and rhythm; no murmurs, clicks, rubs, or gallops. Abdomen:  Normal bowel sounds.  No bruits.  Soft, mild  epigastric tenderness and non-distended without masses, hepatosplenomegaly or hernias noted.  No guarding or rebound tenderness.    Neurologic:  Alert and oriented x3;  grossly normal neurologically. Psych:  Alert and cooperative. Normal Chan and affect.  Imaging Studies: No results found.  Assessment and Plan:   Janice Chan is a 32 y.o. y/o female has been referred for pain after eating differentials would include dyspepsia as well as biliary colic.  Plan 1.  H. pylori breath test 2.  Trial of Prilosec 40 mg once a day for 4 to 6 weeks if no better requires endoscopy 3.  History of microcytic anemia 2 years back note subsequent hemoglobin checked check CBC 4.  If the pain persist at the next visit may also benefit from a HIDA scan and right upper quadrant ultrasound. 5.  If she has iron deficiency anemia may need evaluation with GI and colonoscopy  Follow up in 6 to 8 weeks with Celso Amy  Dr Janice Mood MD,MRCP(U.K)

## 2023-02-05 NOTE — Patient Instructions (Addendum)
Samples given, you will try Linzess 290 mcg first, if you experience diarrhea, decrease the dose to 145 mcg. Please let us know how you are doing on medication. Take 30 minutes before 1st meal of the day  Also, start Prilosec 40 mg daily for 8 weeks. This has been sent to [facility] for you

## 2023-02-07 LAB — CBC WITH DIFFERENTIAL/PLATELET
Basophils Absolute: 0.1 10*3/uL (ref 0.0–0.2)
Basos: 1 %
EOS (ABSOLUTE): 0.1 10*3/uL (ref 0.0–0.4)
Eos: 1 %
Hematocrit: 39.2 % (ref 34.0–46.6)
Hemoglobin: 12.5 g/dL (ref 11.1–15.9)
Immature Grans (Abs): 0 10*3/uL (ref 0.0–0.1)
Immature Granulocytes: 0 %
Lymphocytes Absolute: 2 10*3/uL (ref 0.7–3.1)
Lymphs: 20 %
MCH: 25.9 pg — ABNORMAL LOW (ref 26.6–33.0)
MCHC: 31.9 g/dL (ref 31.5–35.7)
MCV: 81 fL (ref 79–97)
Monocytes Absolute: 1 10*3/uL — ABNORMAL HIGH (ref 0.1–0.9)
Monocytes: 10 %
Neutrophils Absolute: 6.9 10*3/uL (ref 1.4–7.0)
Neutrophils: 68 %
Platelets: 309 10*3/uL (ref 150–450)
RBC: 4.83 x10E6/uL (ref 3.77–5.28)
RDW: 13.7 % (ref 11.7–15.4)
WBC: 10.1 10*3/uL (ref 3.4–10.8)

## 2023-02-07 LAB — H. PYLORI BREATH TEST

## 2023-03-18 ENCOUNTER — Encounter (HOSPITAL_COMMUNITY): Payer: Self-pay | Admitting: *Deleted

## 2023-03-18 ENCOUNTER — Inpatient Hospital Stay (HOSPITAL_COMMUNITY): Payer: Medicaid Other

## 2023-03-18 ENCOUNTER — Inpatient Hospital Stay (HOSPITAL_COMMUNITY)
Admission: EM | Admit: 2023-03-18 | Discharge: 2023-03-19 | Disposition: A | Discharge status: Home / Self Care | Payer: Medicaid Other | Attending: Family Medicine | Admitting: Family Medicine

## 2023-03-18 DIAGNOSIS — O26891 Other specified pregnancy related conditions, first trimester: Secondary | ICD-10-CM | POA: Diagnosis present

## 2023-03-18 DIAGNOSIS — Z3A01 Less than 8 weeks gestation of pregnancy: Secondary | ICD-10-CM | POA: Insufficient documentation

## 2023-03-18 DIAGNOSIS — R1084 Generalized abdominal pain: Secondary | ICD-10-CM | POA: Insufficient documentation

## 2023-03-18 LAB — COMPREHENSIVE METABOLIC PANEL
ALT: 25 U/L (ref 0–44)
AST: 20 U/L (ref 15–41)
Albumin: 3.8 g/dL (ref 3.5–5.0)
Alkaline Phosphatase: 52 U/L (ref 38–126)
Anion gap: 10 (ref 5–15)
BUN: 15 mg/dL (ref 6–20)
CO2: 21 mmol/L — ABNORMAL LOW (ref 22–32)
Calcium: 8.7 mg/dL — ABNORMAL LOW (ref 8.9–10.3)
Chloride: 106 mmol/L (ref 98–111)
Creatinine, Ser: 0.8 mg/dL (ref 0.44–1.00)
GFR, Estimated: >60 mL/min (ref >60)
Glucose, Bld: 121 mg/dL — ABNORMAL HIGH (ref 70–99)
Potassium: 3.7 mmol/L (ref 3.5–5.1)
Sodium: 137 mmol/L (ref 135–145)
Total Bilirubin: 0.9 mg/dL (ref 0.0–1.2)
Total Protein: 6.7 g/dL (ref 6.5–8.1)

## 2023-03-18 LAB — HCG, SERUM, QUALITATIVE: Preg, Serum: POSITIVE — AB

## 2023-03-18 LAB — LIPASE, BLOOD: Lipase: 48 U/L (ref 11–51)

## 2023-03-18 LAB — URINALYSIS, ROUTINE W REFLEX MICROSCOPIC
Bilirubin Urine: NEGATIVE
Glucose, UA: NEGATIVE mg/dL
Hgb urine dipstick: NEGATIVE
Ketones, ur: NEGATIVE mg/dL
Leukocytes,Ua: NEGATIVE
Nitrite: NEGATIVE
Protein, ur: NEGATIVE mg/dL
Specific Gravity, Urine: 1.03 (ref 1.005–1.030)
pH: 6 (ref 5.0–8.0)

## 2023-03-18 LAB — CBC
HCT: 38.4 % (ref 36.0–46.0)
Hemoglobin: 12.5 g/dL (ref 12.0–15.0)
MCH: 25.6 pg — ABNORMAL LOW (ref 26.0–34.0)
MCHC: 32.6 g/dL (ref 30.0–36.0)
MCV: 78.5 fL — ABNORMAL LOW (ref 80.0–100.0)
Platelets: 289 10*3/uL (ref 150–400)
RBC: 4.89 MIL/uL (ref 3.87–5.11)
RDW: 14.8 % (ref 11.5–15.5)
WBC: 9.4 10*3/uL (ref 4.0–10.5)
nRBC: 0 % (ref 0.0–0.2)

## 2023-03-18 LAB — HCG, QUANTITATIVE, PREGNANCY: hCG, Beta Chain, Quant, S: 2185 m[IU]/mL — ABNORMAL HIGH (ref <5)

## 2023-03-18 LAB — WET PREP, GENITAL
Sperm: NONE SEEN
Trich, Wet Prep: NONE SEEN
WBC, Wet Prep HPF POC: >=10 — AB (ref <10)
Yeast Wet Prep HPF POC: NONE SEEN

## 2023-03-18 LAB — ABO/RH: ABO/RH(D): B POS

## 2023-03-18 MED ORDER — METRONIDAZOLE 500 MG PO TABS: 500.0000 mg | ORAL_TABLET | Freq: Two times a day (BID) | ORAL | 0 refills | Status: AC

## 2023-03-18 MED ORDER — ACETAMINOPHEN 500 MG PO TABS
ORAL_TABLET | ORAL | Status: AC
  Administered 2023-03-18: 1000 mg via ORAL
  Filled 2023-03-18: qty 2

## 2023-03-18 MED ORDER — ACETAMINOPHEN 500 MG PO TABS: 1000.0000 mg | ORAL_TABLET | Freq: Once | ORAL | Status: AC

## 2023-03-18 NOTE — MAU Note (Signed)
 Pt says she went to [facility] at 244pm- for abd pain  So they sent her to [facility] ER- positive UPT Then [facility] sent her here.  Now abd pain is 8/10

## 2023-03-18 NOTE — ED Triage Notes (Signed)
 Pt reports lower abd cramping that started today. Reports LMP 12/6. Denies vaginal bleeding, no N/V/D.

## 2023-03-18 NOTE — Discharge Instructions (Signed)
[facility] [place] Address: [address], [place], [place] [address] Hours:  Monday 9AM-5PM Tuesday 10AM-6PM Wednesday 11AM-7PM Thursday 9AM-5PM Friday              8AM-2PM Saturday Sunday  Phone: [phone]  

## 2023-03-18 NOTE — ED Provider Triage Note (Signed)
 Emergency Medicine Provider Triage Evaluation Note  Janice Chan , a 33 y.o. female  was evaluated in triage.  Pt complains of abd/pelvic pain.  Review of Systems  Positive: Irreg./ periods, cramping, clear-ish discharge, mild odor, cramping radiating to back Negative: Vaginal bleeding, N/V/D, SOB, CP,   Physical Exam  BP 139/89 (BP Location: Right Arm)   Pulse (!) 106   Temp 98 F (36.7 C) (Oral)   Resp 16   SpO2 100%  Gen:   Awake, no distress   Resp:  Normal effort  MSK:   Moves extremities without difficulty  Other:    Medical Decision Making  Medically screening exam initiated at 3:33 PM.  Appropriate orders placed.  Janice Chan was informed that the remainder of the evaluation will be completed by another provider, this initial triage assessment does not replace that evaluation, and the importance of remaining in the ED until their evaluation is complete.  Labs ordered   Francis Ileana SAILOR, PA-C 03/18/23 1535

## 2023-03-18 NOTE — ED Notes (Signed)
 The pt upset about the wait time and demanding to speak to the charge nurse the tech message charge nurse and nurse said will be out to talk to the patient soon.

## 2023-03-18 NOTE — MAU Provider Note (Signed)
 History     CSN: [id number]  Arrival date and time: 03/18/23 1516   Event Date/Time   First Provider Initiated Contact with Patient 03/18/23 2230      Chief Complaint  Patient presents with   Abdominal Pain   Janice  D Chan , a  33 y.o. [id number] at [redacted]w[redacted]d presents to MAU after waiting for several hours in [facility] for abdominal pain in the presence of pregnancy. Patient states that abdominal pain started a few weeks ago, but states that it became worse today after picking up a box. She states at that time it was sharp and sudden and has not been relieved since. Currently rating a 7/10. Denies attempting relieve symptoms. She states that she has been having an watery vaginal discharge with a slight odor but it wasn't foul. She denies vaginal bleeding. This was an unplanned pregnancy.          OB History     Gravida  4   Para  2   Term  1   Preterm  1   AB  1   Living  1      SAB  0   IAB  0   Ectopic  0   Multiple  0   Live Births  1           Past Medical History:  Diagnosis Date   BV (bacterial vaginosis)    Chlamydia    at least 3 occasions, most recently 2013   Chlamydia 2013   Contraception management 09/12/2010   Diet controlled gestational diabetes mellitus (GDM) in third trimester 09/14/2020   Fibroid    Fibroids    Obesity    Ovarian cyst    Supervision of high risk pregnancy, antepartum 04/05/2020    Nursing Staff Provider Office Location  [facility] Dating    Language   English Anatomy US    normal but needs fup for growth due to IUFD Flu Vaccine  Declined Genetic Screen  NIPS:  LR  AFP:  Screen negative  First Screen:  Quad:   TDaP Vaccine  06/23/2020 Hgb A1C or  GTT Early: normal  Third trimester  COVID Vaccine Declines    LAB RESULTS  Rhogam   Blood Type B/Positive/-- (01/10 0000)  Feeding Pl   UTI (urinary tract infection)     Past Surgical History:  Procedure Laterality Date   ANKLE SURGERY  2007   INDUCED ABORTION     WISDOM TOOTH  EXTRACTION  2011    Family History  Problem Relation Age of Onset   Breast cancer Mother 11   Asthma Sister    Breast cancer Maternal Aunt 34   Prostate cancer Maternal Grandfather     Social History   Tobacco Use   Smoking status: Former    Current packs/day: 0.00    Types: Cigarettes    Quit date: 06/11/2020    Years since quitting: 2.7   Smokeless tobacco: Never  Vaping Use   Vaping status: Never Used  Substance Use Topics   Alcohol use: Not Currently    Comment: occasional   Drug use: Not Currently    Types: Marijuana    Comment: not since + pregnancy    Allergies: No Known Allergies  Medications Prior to Admission  Medication Sig Dispense Refill Last Dose/Taking   ferrous sulfate  325 (65 FE) MG tablet Take 325 mg by mouth daily with breakfast.      Multiple Vitamin (MULTIVITAMIN) tablet Take 1 tablet by mouth daily.  omeprazole  (PRILOSEC) 40 MG capsule Take 1 capsule (40 mg total) by mouth daily. 30 capsule 1     Review of Systems  Constitutional:  Negative for chills, fatigue and fever.  Eyes:  Negative for pain and visual disturbance.  Respiratory:  Negative for apnea, shortness of breath and wheezing.   Cardiovascular:  Negative for chest pain and palpitations.  Gastrointestinal:  Positive for abdominal pain. Negative for constipation, diarrhea, nausea and vomiting.  Genitourinary:  Positive for vaginal discharge. Negative for difficulty urinating, dysuria, pelvic pain, vaginal bleeding and vaginal pain.  Musculoskeletal:  Negative for back pain.  Neurological:  Negative for seizures, weakness and headaches.  Psychiatric/Behavioral:  Negative for suicidal ideas.    Physical Exam   Blood pressure (!) 139/96, pulse 68, temperature 98.1 F (36.7 C), temperature source Oral, resp. rate 16, height 5' 5.5 (1.664 m), weight 80.5 kg, last menstrual period 02/16/2023, SpO2 100%, currently breastfeeding.  Physical Exam Vitals (pt very tearful and agitated in  triage. She is upset that she has been here so long and did not know she was pregnant. She found out via MyChart in the ED lobby while awaiting to be seen.) and nursing note reviewed.  Constitutional:      General: She is in acute distress.     Appearance: Normal appearance.  HENT:     Head: Normocephalic.  Pulmonary:     Effort: Pulmonary effort is normal.  Abdominal:     Tenderness: There is abdominal tenderness. There is no guarding.  Musculoskeletal:     Cervical back: Normal range of motion.  Skin:    General: Skin is warm and dry.  Neurological:     Mental Status: She is alert and oriented to person, place, and time.  Psychiatric:        Mood and Affect: Mood normal.     MAU Course  Procedures Orders Placed This Encounter  Procedures   Wet prep, genital   US  OB LESS THAN 14 WEEKS WITH OB TRANSVAGINAL   Lipase, blood   Comprehensive metabolic panel   CBC   Urinalysis, Routine w reflex microscopic -Urine, Clean Catch   hCG, serum, qualitative   hCG, quantitative, pregnancy   Diet NPO time specified   ABO/Rh   Meds ordered this encounter  Medications   acetaminophen  (TYLENOL ) tablet 1,000 mg   acetaminophen  (TYLENOL ) 500 MG tablet    Janice Chan, Janice Chan: cabinet override   Results for orders placed or performed during the hospital encounter of 03/18/23 (from the past 24 hours)  Lipase, blood     Status: None   Collection Time: 03/18/23  3:23 PM  Result Value Ref Range   Lipase 48 11 - 51 U/L  Comprehensive metabolic panel     Status: Abnormal   Collection Time: 03/18/23  3:23 PM  Result Value Ref Range   Sodium 137 135 - 145 mmol/L   Potassium 3.7 3.5 - 5.1 mmol/L   Chloride 106 98 - 111 mmol/L   CO2 21 (L) 22 - 32 mmol/L   Glucose, Bld 121 (H) 70 - 99 mg/dL   BUN 15 6 - 20 mg/dL   Creatinine, Ser 9.19 0.44 - 1.00 mg/dL   Calcium 8.7 (L) 8.9 - 10.3 mg/dL   Total Protein 6.7 6.5 - 8.1 g/dL   Albumin 3.8 3.5 - 5.0 g/dL   AST 20 15 - 41 U/L   ALT 25 0 - 44  U/L   Alkaline Phosphatase 52 38 - 126 U/L  Total Bilirubin 0.9 0.0 - 1.2 mg/dL   GFR, Estimated >39 >39 mL/min   Anion gap 10 5 - 15  CBC     Status: Abnormal   Collection Time: 03/18/23  3:23 PM  Result Value Ref Range   WBC 9.4 4.0 - 10.5 K/uL   RBC 4.89 3.87 - 5.11 MIL/uL   Hemoglobin 12.5 12.0 - 15.0 g/dL   HCT 61.5 63.9 - 53.9 %   MCV 78.5 (L) 80.0 - 100.0 fL   MCH 25.6 (L) 26.0 - 34.0 pg   MCHC 32.6 30.0 - 36.0 g/dL   RDW 85.1 88.4 - 84.4 %   Platelets 289 150 - 400 K/uL   nRBC 0.0 0.0 - 0.2 %  Urinalysis, Routine w reflex microscopic -Urine, Clean Catch     Status: None   Collection Time: 03/18/23  3:23 PM  Result Value Ref Range   Color, Urine YELLOW YELLOW   APPearance CLEAR CLEAR   Specific Gravity, Urine 1.030 1.005 - 1.030   pH 6.0 5.0 - 8.0   Glucose, UA NEGATIVE NEGATIVE mg/dL   Hgb urine dipstick NEGATIVE NEGATIVE   Bilirubin Urine NEGATIVE NEGATIVE   Ketones, ur NEGATIVE NEGATIVE mg/dL   Protein, ur NEGATIVE NEGATIVE mg/dL   Nitrite NEGATIVE NEGATIVE   Leukocytes,Ua NEGATIVE NEGATIVE  hCG, serum, qualitative     Status: Abnormal   Collection Time: 03/18/23  3:23 PM  Result Value Ref Range   Preg, Serum POSITIVE (A) NEGATIVE  Wet prep, genital     Status: Abnormal   Collection Time: 03/18/23  3:38 PM   Specimen: Urine, Clean Catch  Result Value Ref Range   Yeast Wet Prep HPF POC NONE SEEN NONE SEEN   Trich, Wet Prep NONE SEEN NONE SEEN   Clue Cells Wet Prep HPF POC PRESENT (A) NONE SEEN   WBC, Wet Prep HPF POC >=10 (A) <10   Sperm NONE SEEN   hCG, quantitative, pregnancy     Status: Abnormal   Collection Time: 03/18/23 10:11 PM  Result Value Ref Range   hCG, Beta Chain, Quant, Chan 2,185 (H) <5 mIU/mL  ABO/Rh     Status: None   Collection Time: 03/18/23 10:11 PM  Result Value Ref Range   ABO/RH(D) B POS    No rh immune globuloin      NOT A RH IMMUNE GLOBULIN CANDIDATE, PT RH POSITIVE Performed at [facility], [address],  [place], [place] [address]    US  OB LESS THAN 14 WEEKS WITH OB TRANSVAGINAL Result Date: 03/18/2023 CLINICAL DATA:  [id number] Abdominal pain [id number] EXAM: OBSTETRIC <14 WK US  AND TRANSVAGINAL OB US  TECHNIQUE: Transvaginal OB ultrasound was performed for complete evaluation of the gestation as well as the maternal uterus, adnexal regions, and pelvic cul-de-sac. COMPARISON:  None Available. FINDINGS: Intrauterine gestational sac: Single Yolk sac:  Not Visualized. Embryo:  Not Visualized. Cardiac Activity: Not Visualized. MSD: 0.4 cm = 5 weeks 1 day US  EDC: 11/17/2023 Subchorionic hemorrhage:  None visualized. Adnexa: No masses or fluid collections. Ovaries demonstrate blood flow with color Doppler IMPRESSION: Intrauterine sac measuring 5 weeks 1 day with an ultrasound EDC of 11/17/2023. Yolk sac and fetal pole not yet identified. No adnexal pathology. Electronically Signed   By: Fonda Field M.D.   On: 03/18/2023 23:13     MDM - PO Tylenol  ordered for pain.  - White Count normal - UA normal  -  Wet prep notable for BV  - Hcg 2185  -  GC pending upon discharge.  - IUP noted measuring about 5 weeks 0 days.  - Patient considering termination. Pregnancy verification letter and other resources provided.  - plan for discharge.   Assessment and Plan   1. Generalized abdominal pain   2. [redacted] weeks gestation of pregnancy   3. Less than [redacted] weeks gestation of pregnancy    - Reviewed results with patient.  - Reviewed worsening signs and return precautions.  - Patient discharged home in stable condition and may return to MAU as needed  - Metronidazole  sent to outpatient pharmacy for BV.     Claris CHRISTELLA Cedar, MSN CNM  03/18/2023, 10:30 PM

## 2023-03-18 NOTE — ED Notes (Signed)
 Pt repeatedly questioning the sort techs as to why she has not been sent to [facility] after seeing her lab results in her MyChart. This tech tried to explain that we do not make those decisions and it is up to the doctor to decide that. Pt continues to question sort techs as to why she hasn't been sent there and then abruptly walks away once we try to explain again.

## 2023-03-18 NOTE — Progress Notes (Deleted)
 Ellouise Console, PA-C [address]  [address]  [place], [place] [address]  Main: [phone]  Fax: [phone]   Primary Care Physician: Vannie Cornell SAUNDERS, CNM  Primary Gastroenterologist:  Ellouise Console, PA-C / Dr. Ruel Kung    CC: F/U Abdominal Pain  HPI: Janice  D Chan is a 33 y.o. female returns for follow-up of abdominal pain.  Patient saw Dr. Kung to evaluate epigastric abdominal pain at 02/05/2023.  Epigastric pain occurred 10 to 15 minutes after eating a meal, especially fatty or greasy food.  Has history of constipation.  She tried MiraLAX with little benefit.  Uses hemp oil.  She was tried on Prilosec 40 mg daily.  02/05/2023 H. pylori breath test negative.  Normal CBC with hemoglobin 12.5.  History of microcytic anemia 2 years ago which resolved.  03/18/2023 labs (yesterday at ED): CBC, CMP, lipase labs normal.  Positive pregnancy test.  Quantitative hCG 2,185.  Current Outpatient Medications  Medication Sig Dispense Refill   ferrous sulfate  325 (65 FE) MG tablet Take 325 mg by mouth daily with breakfast.     metroNIDAZOLE  (FLAGYL ) 500 MG tablet Take 1 tablet (500 mg total) by mouth 2 (two) times daily. 14 tablet 0   metroNIDAZOLE  (FLAGYL ) 500 MG tablet Take 1 tablet (500 mg total) by mouth 2 (two) times daily. 14 tablet 0   Multiple Vitamin (MULTIVITAMIN) tablet Take 1 tablet by mouth daily.     omeprazole  (PRILOSEC) 40 MG capsule Take 1 capsule (40 mg total) by mouth daily. 30 capsule 1   No current facility-administered medications for this visit.    Allergies as of 03/19/2023   (No Known Allergies)    Past Medical History:  Diagnosis Date   BV (bacterial vaginosis)    Chlamydia    at least 3 occasions, most recently 2013   Chlamydia 2013   Contraception management 09/12/2010   Diet controlled gestational diabetes mellitus (GDM) in third trimester 09/14/2020   Fibroid    Fibroids    Obesity    Ovarian cyst    Supervision of high risk pregnancy,  antepartum 04/05/2020    Nursing Staff Provider Office Location  [facility] Dating    Language   English Anatomy US    normal but needs fup for growth due to IUFD Flu Vaccine  Declined Genetic Screen  NIPS:  LR  AFP:  Screen negative  First Screen:  Quad:   TDaP Vaccine  06/23/2020 Hgb A1C or  GTT Early: normal  Third trimester  COVID Vaccine Declines    LAB RESULTS  Rhogam   Blood Type B/Positive/-- (01/10 0000)  Feeding Pl   UTI (urinary tract infection)     Past Surgical History:  Procedure Laterality Date   ANKLE SURGERY  2007   INDUCED ABORTION     WISDOM TOOTH EXTRACTION  2011    Review of Systems:    All systems reviewed and negative except where noted in HPI.   Physical Examination:   LMP 02/16/2023   General: Well-nourished, well-developed in no acute distress.  Lungs: Clear to auscultation bilaterally. Non-labored. Heart: Regular rate and rhythm, no murmurs rubs or gallops.  Abdomen: Bowel sounds are normal; Abdomen is Soft; No hepatosplenomegaly, masses or hernias;  No Abdominal Tenderness; No guarding or rebound tenderness. Neuro: Alert and oriented x 3.  Grossly intact.  Psych: Alert and cooperative, normal mood and affect.   Imaging Studies: US  OB LESS THAN 14 WEEKS WITH OB TRANSVAGINAL Result Date: 03/18/2023 CLINICAL DATA:  [id number] Abdominal pain [id number] EXAM: OBSTETRIC <14 WK US  AND TRANSVAGINAL OB US  TECHNIQUE: Transvaginal OB ultrasound was performed for complete evaluation of the gestation as well as the maternal uterus, adnexal regions, and pelvic cul-de-sac. COMPARISON:  None Available. FINDINGS: Intrauterine gestational sac: Single Yolk sac:  Not Visualized. Embryo:  Not Visualized. Cardiac Activity: Not Visualized. MSD: 0.4 cm = 5 weeks 1 day US  EDC: 11/17/2023 Subchorionic hemorrhage:  None visualized. Adnexa: No masses or fluid collections. Ovaries demonstrate blood flow with color Doppler IMPRESSION: Intrauterine sac measuring 5 weeks 1 day with an ultrasound EDC of  11/17/2023. Yolk sac and fetal pole not yet identified. No adnexal pathology. Electronically Signed   By: Fonda Field M.D.   On: 03/18/2023 23:13    Assessment and Plan:   Janice Chan is a 33 y.o. y/o female ***    Ellouise Console, PA-C  Follow up ***  BP check ***

## 2023-03-19 ENCOUNTER — Ambulatory Visit: Payer: Medicaid Other | Admitting: Physician Assistant

## 2023-03-19 ENCOUNTER — Encounter: Payer: Self-pay | Admitting: Physician Assistant

## 2023-03-19 LAB — GC/CHLAMYDIA PROBE AMP (~~LOC~~) NOT AT ARMC
Chlamydia: NEGATIVE
Comment: NEGATIVE
Comment: NORMAL
Neisseria Gonorrhea: NEGATIVE

## 2024-05-21 ENCOUNTER — Encounter: Payer: Self-pay | Admitting: Certified Nurse Midwife
